# Patient Record
Sex: Male | Born: 1955 | Race: White | Hispanic: No | Marital: Married | Smoking: Never smoker
Health system: Southern US, Community
[De-identification: ages and names within clinical notes are randomized; demographics above are authoritative.]

## PROBLEM LIST (undated history)

## (undated) DIAGNOSIS — R42 Dizziness and giddiness: Secondary | ICD-10-CM

## (undated) DIAGNOSIS — R200 Anesthesia of skin: Secondary | ICD-10-CM

## (undated) DIAGNOSIS — K5790 Diverticulosis of intestine, part unspecified, without perforation or abscess without bleeding: Secondary | ICD-10-CM

## (undated) HISTORY — PX: EYE SURGERY: SHX253

## (undated) HISTORY — PX: OTHER SURGICAL HISTORY: SHX169

## (undated) HISTORY — DX: Dizziness and giddiness: R42

## (undated) HISTORY — DX: Diverticulosis of intestine, part unspecified, without perforation or abscess without bleeding: K57.90

## (undated) HISTORY — DX: Anesthesia of skin: R20.0

---

## 2001-06-05 ENCOUNTER — Encounter: Payer: Self-pay | Admitting: Gastroenterology

## 2001-06-11 ENCOUNTER — Encounter: Payer: Self-pay | Admitting: Gastroenterology

## 2005-02-26 ENCOUNTER — Ambulatory Visit: Payer: Self-pay | Admitting: Internal Medicine

## 2006-12-16 ENCOUNTER — Ambulatory Visit: Payer: Self-pay | Admitting: Internal Medicine

## 2006-12-16 LAB — CONVERTED CEMR LAB
Chloride: 112 meq/L (ref 96–112)
Cholesterol: 147 mg/dL (ref 0–200)
GFR calc Af Amer: 114 mL/min
GFR calc non Af Amer: 94 mL/min
Glucose, Bld: 91 mg/dL (ref 70–99)
HDL: 44.1 mg/dL (ref >39.0)
LDL Cholesterol: 95 mg/dL (ref 0–99)
Potassium: 4.4 meq/L (ref 3.5–5.1)
Sodium: 145 meq/L (ref 135–145)
Total CHOL/HDL Ratio: 3.3
Triglycerides: 40 mg/dL (ref 0–149)

## 2008-01-08 ENCOUNTER — Ambulatory Visit: Payer: Self-pay | Admitting: Internal Medicine

## 2010-02-03 ENCOUNTER — Ambulatory Visit: Payer: Self-pay | Admitting: Internal Medicine

## 2010-02-03 LAB — CONVERTED CEMR LAB
ALT: 22 units/L (ref 0–53)
AST: 25 units/L (ref 0–37)
Albumin: 4.3 g/dL (ref 3.5–5.2)
Bilirubin Urine: NEGATIVE
Chloride: 104 meq/L (ref 96–112)
Eosinophils Relative: 2.5 % (ref 0.0–5.0)
GFR calc non Af Amer: 76.75 mL/min (ref >60)
Glucose, Bld: 98 mg/dL (ref 70–99)
HCT: 42.8 % (ref 39.0–52.0)
Hemoglobin: 14.9 g/dL (ref 13.0–17.0)
Ketones, ur: NEGATIVE mg/dL
Leukocytes, UA: NEGATIVE
Lymphs Abs: 1.9 10*3/uL (ref 0.7–4.0)
Monocytes Relative: 8.3 % (ref 3.0–12.0)
Neutro Abs: 3.1 10*3/uL (ref 1.4–7.7)
Potassium: 4.9 meq/L (ref 3.5–5.1)
RDW: 13.3 % (ref 11.5–14.6)
Sodium: 138 meq/L (ref 135–145)
TSH: 2.32 microintl units/mL (ref 0.35–5.50)
Urobilinogen, UA: 0.2 (ref 0.0–1.0)
VLDL: 11.6 mg/dL (ref 0.0–40.0)

## 2010-02-10 ENCOUNTER — Ambulatory Visit: Payer: Self-pay | Admitting: Internal Medicine

## 2010-02-10 ENCOUNTER — Encounter: Payer: Self-pay | Admitting: Internal Medicine

## 2010-02-10 DIAGNOSIS — N400 Enlarged prostate without lower urinary tract symptoms: Secondary | ICD-10-CM | POA: Insufficient documentation

## 2010-02-10 DIAGNOSIS — G479 Sleep disorder, unspecified: Secondary | ICD-10-CM | POA: Insufficient documentation

## 2010-02-22 ENCOUNTER — Encounter (INDEPENDENT_AMBULATORY_CARE_PROVIDER_SITE_OTHER): Payer: Self-pay | Admitting: *Deleted

## 2010-04-10 ENCOUNTER — Encounter: Payer: Self-pay | Admitting: Gastroenterology

## 2010-04-10 ENCOUNTER — Ambulatory Visit
Admission: RE | Admit: 2010-04-10 | Discharge: 2010-04-10 | Payer: Self-pay | Source: Home / Self Care | Attending: Gastroenterology | Admitting: Gastroenterology

## 2010-04-10 DIAGNOSIS — K594 Anal spasm: Secondary | ICD-10-CM | POA: Insufficient documentation

## 2010-04-10 DIAGNOSIS — K921 Melena: Secondary | ICD-10-CM | POA: Insufficient documentation

## 2010-04-20 ENCOUNTER — Ambulatory Visit: Admit: 2010-04-20 | Payer: Self-pay | Admitting: Gastroenterology

## 2010-04-25 NOTE — Letter (Signed)
Summary: New Patient letter  [facility]  [address] [place], [place] [address]   Phone: [phone]  Fax: [phone]       02/22/2010 MRN: [id number]  Robert Nelson [address] [place], [place]  [address]  Dear Robert Nelson,  Welcome to the Gastroenterology Division at [facility].    You are scheduled to see Dr. Russella Dar on 04/10/2010 at 10:15AM on the 3rd floor at [facility], [address]  We ask that you try to arrive at our office 15 minutes prior to your appointment time to allow for check-in.  We would like you to complete the enclosed self-administered evaluation form prior to your visit and bring it with you on the day of your appointment.  We will review it with you.  Also, please bring a complete list of all your medications or, if you prefer, bring the medication bottles and we will list them.  Please bring your insurance card so that we may make a copy of it.  If your insurance requires a referral to see a specialist, please bring your referral form from your primary care physician.  Co-payments are due at the time of your visit and may be paid by cash, check or credit card.     Your office visit will consist of a consult with your physician (includes a physical exam), any laboratory testing he/she may order, scheduling of any necessary diagnostic testing (e.g. x-ray, ultrasound, CT-scan), and scheduling of a procedure (e.g. Endoscopy, Colonoscopy) if required.  Please allow enough time on your schedule to allow for any/all of these possibilities.    If you cannot keep your appointment, please call [phone] to cancel or reschedule prior to your appointment date.  This allows [name] the opportunity to schedule an appointment for another patient in need of care.  If you do not cancel or reschedule by 5 p.m. the business day prior to your appointment date, you will be charged a $50.00 late cancellation/no-show fee.    Thank you for choosing [facility] for your medical needs.  We appreciate the opportunity to care for you.  Please visit [name] at our website  to learn more about our practice.                     Sincerely,                                                             The Gastroenterology Division

## 2010-04-25 NOTE — Assessment & Plan Note (Signed)
Summary: PHYSICAL--STC   Vital Signs:  Patient profile:   55 year old male Height:      72 inches Weight:      172 pounds BMI:     23.41 O2 Sat:      97 % on Room air Temp:     98.3 degrees F oral Pulse rate:   82 / minute BP sitting:   100 / 72  (left arm) Cuff size:   regular  Vitals Entered By: Bill Salinas CMA (February 10, 2010 3:45 PM)  O2 Flow:  Room air CC: cpx/ ab   Primary Care Provider:  Jacques Navy MD  CC:  cpx/ ab.  History of Present Illness: Patient presents for routine medical follow-up.  Sleep issue - awakens after 2-4 hours and then dozes for 2 hrs. (operant conditioning with control of sleep interval.   Right shoulder pain - throwing a ball is most aggrevating. This has been a problem for years and never worked.  Slowed stream.  2-3 months ago had started have increased BMs to 5-6 per day from a base of 3-4.   Current Medications (verified): 1)  None  Allergies (verified): No Known Drug Allergies  Past History:  Past Medical History: Last updated: 01/08/2008 Unremarkable  Past Surgical History: ORIF wrist fracture at age 58 Vasectomy Eye surgery for wandering eye at age 64  Family History: father - 70: no health problems mother - 1932: OA knee s/p surgery Neg-DM, prostate cancer, CAD PUnlce - colon cancer,died in his 62's  Social History: [place]-[employer] education married '77 1 son - '82, 1 dtr '84; 1 grandchild work - Insurance underwriter  Never Smoked Alcohol use-no Drug use-no Regular exercise-yes  Review of Systems  The patient denies anorexia, fever, weight loss, weight gain, vision loss, decreased hearing, chest pain, syncope, dyspnea on exertion, peripheral edema, prolonged cough, hemoptysis, abdominal pain, hematochezia, severe indigestion/heartburn, incontinence, muscle weakness, transient blindness, difficulty walking, depression, unusual weight change, enlarged lymph nodes, angioedema, and testicular masses.     Physical Exam  General:  Well-developed,well-nourished,in no acute distress; alert,appropriate and cooperative throughout examination Head:  Normocephalic and atraumatic without obvious abnormalities. No apparent alopecia or balding. Eyes:  No corneal or conjunctival inflammation noted. EOMI. Perrla. Funduscopic exam benign, without hemorrhages, exudates or papilledema. Vision grossly normal. Ears:  External ear exam shows no significant lesions or deformities.  Otoscopic examination reveals clear canals, tympanic membranes are intact bilaterally without bulging, retraction, inflammation or discharge. Hearing is grossly normal bilaterally. Nose:  no external deformity and no external erythema.   Mouth:  Oral mucosa and oropharynx without lesions or exudates.  Teeth in good repair. Neck:  supple, no thyromegaly, and no carotid bruits.   Chest Wall:  No deformities, masses, tenderness or gynecomastia noted. Lungs:  Normal respiratory effort, chest expands symmetrically. Lungs are clear to auscultation, no crackles or wheezes. Heart:  Normal rate and regular rhythm. S1 and S2 normal without gallop, murmur, click, rub or other extra sounds. Abdomen:  Bowel sounds positive,abdomen soft and non-tender without masses, organomegaly or hernias noted. Prostate:  deferred to normal PSA Msk:  Right shoulder with normal ROM, no crepitis, no click. Remainder of exam normal Pulses:  2+ radial and DP pusles Extremities:  No clubbing, cyanosis, edema, or deformity noted with normal full range of motion of all joints.   Neurologic:  alert & oriented X3, cranial nerves II-XII intact, gait normal, and DTRs symmetrical and normal.   Skin:  turgor normal, color  normal, no rashes, and no suspicious lesions.   Cervical Nodes:  no anterior cervical adenopathy and no posterior cervical adenopathy.   Inguinal Nodes:  no R inguinal adenopathy and no L inguinal adenopathy.   Psych:  Oriented X3, normally interactive,  good eye contact, and not anxious appearing.     Impression & Recommendations:  Problem # 1:  OTHER SYMPTOMS INVOLVING DIGESTIVE SYSTEM OTHER (ICD-787.99)  Patient with a change in bowel habit: frequency and caliber.  Plan - diagnostic colonosocopy  Orders: Gastroenterology Referral (GI)  Problem # 2:  HYPERTROPHY PROSTATE W/O UR OBST & OTH LUTS (ICD-600.00) Patient with early signs of prostatism. He is not disturbed by his symptoms. He has no sleep disruption or issues of urgency.  Plan - watchful waiting.  Problem # 3:  SLEEP DISORDER, CHRONIC (ICD-780.50) Patient with Stein term sleep disorder with short duration of sleep. Reviewed sleep hygiene: regular schedule for retiring and rising; no stimulants; exercise but at least 3 hours before retiring; sleep sanctuary; avoiding extinction behaviors - laying in bed awake. discussed operant conditioning of setting sleep interval to just beyond his current sleep interval and extending in 30 minute increments when he attains goal interval 80% of the time. May use a hypnotic every 3rd to 4th night as needed.  Problem # 4:  Preventive Health Care (ICD-V70.0) Interval history is unremarkable except for problems listed above. Physical exam is normal, including a normal shoulder exam. If this continues to bother him a sports medicine consult will be arranged. Lab results are within normal limits. 12 lead EKG without evidence of injury or ischemia.Imunizations - patient believes tetnus is up to date.  In summary- a very nice man who appears healty. the change in bowel habit does require evaluation. If all goes well I have recommended follow-up general physical exam in 3 years with routine labs at that time. He will otherwise return on a as needed basis.   Other Orders: EKG w/ Interpretation (93000)  Patient: Jazmine Wickizer Note: All result statuses are Final unless otherwise noted.  Tests: (1) Lipid Panel (LIPID)   Cholesterol               192  mg/dL                   1-914     ATP III Classification            Desirable:  < 200 mg/dL                    Borderline High:  200 - 239 mg/dL               High:  > = 240 mg/dL   Triglycerides             58.0 mg/dL                  7.8-295.6     Normal:  <150 mg/dL     Borderline High:  213 - 199 mg/dL   HDL                       08.65 mg/dL                 >78.46   VLDL Cholesterol          11.6 mg/dL                  9.6-29.5   LDL  Cholesterol      [H]  121 mg/dL                   1-61  CHO/HDL Ratio:  CHD Risk                             3                    Men          Women     1/2 Average Risk     3.4          3.3     Average Risk          5.0          4.4     2X Average Risk          9.6          7.1     3X Average Risk          15.0          11.0                           Tests: (2) BMP (METABOL)   Sodium                    138 mEq/L                   135-145   Potassium                 4.9 mEq/L                   3.5-5.1   Chloride                  104 mEq/L                   96-112   Carbon Dioxide            30 mEq/L                    19-32   Glucose                   98 mg/dL                    09-60   BUN                       16 mg/dL                    4-54   Creatinine                1.1 mg/dL                   0.9-8.1   Calcium                   9.7 mg/dL                   1.9-14.7   GFR                       76.75 mL/min                >  60  Tests: (3) CBC Platelet w/Diff (CBCD)   White Cell Count          5.6 K/uL                    4.5-10.5   Red Cell Count            4.50 Mil/uL                 4.22-5.81   Hemoglobin                14.9 g/dL                   29.5-28.4   Hematocrit                42.8 %                      39.0-52.0   MCV                       95.3 fl                     78.0-100.0   MCHC                      34.7 g/dL                   13.2-44.0   RDW                       13.3 %                      11.5-14.6   Platelet Count             172.0 K/uL                  150.0-400.0   Neutrophil %              55.0 %                      43.0-77.0   Lymphocyte %              33.6 %                      12.0-46.0   Monocyte %                8.3 %                       3.0-12.0   Eosinophils%              2.5 %                       0.0-5.0   Basophils %               0.6 %                       0.0-3.0   Neutrophill Absolute      3.1 K/uL                    1.4-7.7   Lymphocyte Absolute       1.9 K/uL  0.7-4.0   Monocyte Absolute         0.5 K/uL                    0.1-1.0  Eosinophils, Absolute                             0.1 K/uL                    0.0-0.7   Basophils Absolute        0.0 K/uL                    0.0-0.1  Tests: (4) Hepatic/Liver Function Panel (HEPATIC)   Total Bilirubin           0.7 mg/dL                   3.7-1.6   Direct Bilirubin          0.1 mg/dL                   9.6-7.8   Alkaline Phosphatase      49 U/L                      39-117   AST                       25 U/L                      0-37   ALT                       22 U/L                      0-53   Total Protein             7.5 g/dL                    9.3-8.1   Albumin                   4.3 g/dL                    0.1-7.5  Tests: (5) TSH (TSH)   FastTSH                   2.32 uIU/mL                 0.35-5.50  Tests: (6) UDip Only (UDIP)   Color                     LT. YELLOW       RANGE:  Yellow;Lt. Yellow   Clarity                   CLEAR                       Clear   Specific Gravity          <=1.005                     1.000 - 1.030   Urine Ph                  6.5  5.0-8.0   Protein                   NEGATIVE                    Negative   Urine Glucose             NEGATIVE                    Negative   Ketones                   NEGATIVE                    Negative   Urine Bilirubin           NEGATIVE                    Negative   Blood                     NEGATIVE                    Negative    Urobilinogen              0.2                         0.0 - 1.0   Leukocyte Esterace        NEGATIVE                    Negative   Nitrite                   NEGATIVE                    Negative  Tests: (7) Prostate Specific Antigen (PSA)   PSA-Hyb                   0.21 ng/mL                  0.10-4.00Prescriptions: LOSARTAN POTASSIUM-HCTZ 100-25 MG TABS (LOSARTAN POTASSIUM-HCTZ) 1 by mouth once daily for BP  #90 x 3   Entered and Authorized by:   Jacques Navy MD   Signed by:   Jacques Navy MD on 02/10/2010   Method used:   Electronically to        [facility] [phone]* (retail)       [address] [place], [place]  [address]       Ph: [phone]       Fax: [phone]   RxID:   [id number]    Orders Added: 1)  Gastroenterology Referral [GI] 2)  Est. Patient 40-64 years [99396] 3)  New Patient Level III [24401] 4)  EKG w/ Interpretation [93000]

## 2010-04-27 NOTE — Procedures (Signed)
Summary: Colonoscopy   Colonoscopy  Procedure date:  06/05/2001  Findings:      Results: Hemorrhoids.     Location:  [facility].    Procedures Next Due Date:    Colonoscopy: 05/2008  Colonoscopy  Procedure date:  06/05/2001  Findings:      Results: Hemorrhoids.     Location:   Endoscopy Center.    Procedures Next Due Date:    Colonoscopy: 05/2008 Patient Name: Robert Nelson, Robert Nelson MRN:  Procedure Procedures: Colonoscopy CPT: 16109.  Personnel: Endoscopist: Venita Lick. Russella Dar, MD, Clementeen Graham.  Referred By: Rosalyn Gess. Norins, MD.  Exam Location: Exam performed in Outpatient Clinic. Outpatient  Patient Consent: Procedure, Alternatives, Risks and Benefits discussed, consent obtained, from patient. Consent was obtained by the RN.  Indications  Evaluation of: Positive fecal occult blood test  Symptoms: LLQ pain.  History  Pre-Exam Physical: Performed Jun 05, 2001. Entire physical exam was normal.  Exam Exam: Extent of exam reached: Cecum, extent intended: Cecum.  The cecum was identified by appendiceal orifice and IC valve. Colon retroflexion performed. ASA Classification: II. Tolerance: good.  Monitoring: Pulse and BP monitoring, Oximetry used. Supplemental O2 given.  Colon Prep Used Golytely for colon prep. Prep results: excellent.  Sedation Meds: Patient assessed and found to be appropriate for moderate (conscious) sedation. Fentanyl 100 mcg. given IV. Versed 10 mg. given IV.  Findings NORMAL EXAM: Cecum to Sigmoid Colon.  HEMORRHOIDS: Internal. Size: Medium. Not bleeding. Not thrombosed. ICD9: Hemorrhoids, Internal: 455.0.   Assessment  Diagnoses: 455.0: Hemorrhoids, Internal.   Events  Unplanned Interventions: No intervention was required.  Unplanned Events: There were no complications. Plans Patient Education: Patient given standard instructions for: Hemorrhoids.  Disposition: After procedure patient sent to recovery. After  recovery patient sent home.  Scheduling/Referral: Colonoscopy, to [facility]. Russella Dar, MD, [facility], around Jun 05, 2008.  EGD, to [employer]. Russella Dar, MD, Clementeen Graham,  Referring provider, to Rosalyn Gess. Norins, MD,    This report was created from the original endoscopy report, which was reviewed and signed by the above listed endoscopist.    cc: Illene Regulus, MD

## 2010-04-27 NOTE — Procedures (Signed)
Summary: EGD   EGD  Procedure date:  06/11/2001  Findings:      Findings: Gastritis  Location:  [facility]oscopy Center   Patient Name: Robert Nelson, Robert Nelson MRN:  Procedure Procedures: Panendoscopy (EGD) CPT: 43235.  Personnel: Endoscopist: Venita Lick. Russella Dar, MD, Clementeen Graham.  Exam Location: Exam performed in Outpatient Clinic. Outpatient  Patient Consent: Procedure, Alternatives, Risks and Benefits discussed, consent obtained, from patient. Consent was obtained by the RN.  Indications  Evaluation of: Positive fecal occult blood test  Symptoms: Abdominal pain, location: RLQ.  History  Pre-Exam Physical: Performed Jun 05, 2001  Entire physical exam was normal.  Exam Exam Info: Maximum depth of insertion Duodenum, intended Duodenum. Vocal cords not visualized. Gastric retroflexion performed. ASA Classification: II. Tolerance: good.  Sedation Meds: Patient assessed and found to be appropriate for moderate (conscious) sedation. Fentanyl 50 mcg. given IV. Versed 5 mg. given IV. Cetacaine Spray 1 sprays given aerosolized.  Monitoring: BP and pulse monitoring done. Oximetry used. Supplemental O2 given  Findings Normal: Proximal Esophagus to Distal Esophagus.  MUCOSAL ABNORMALITY: Body to Fundus. Thin (atrophic) folds. ICD9: Gastritis, Unspecified: 535.50.  Normal: Antrum to Duodenal 2nd Portion.   Assessment  Diagnoses: 535.50: Gastritis, Unspecified.   Events  Unplanned Intervention: No unplanned interventions were required.  Unplanned Events: There were no complications. Plans Medication(s): Continue current medications.  Disposition: After procedure patient sent to recovery. After recovery patient sent home.  Scheduling: Primary Care Provider, to Rosalyn Gess. Norins, MD, consider abd/pelvic CT if lower abd pain persists Jul 12, 2001.    This report was created from the original endoscopy report, which was reviewed and signed by the above listed endoscopist.    cc:  Illene Regulus, MD

## 2010-04-27 NOTE — Assessment & Plan Note (Signed)
Summary: CHANGE IN BM'S...AS.   History of Present Illness Visit Type: Initial Consult Primary GI MD: Elie Goody MD [facility] Primary Provider: Jacques Navy MD Requesting Provider: Illene Regulus, MD Chief Complaint: frequency in bowel movement 5-6 x daily History of Present Illness:   Robert Nelson is a 55 year old male, who has recently noted a change in bowel habits along with scant amounts of rectal bleeding. He states he typically had 2-3 bowel movements daily, however, for several weeks in October 5-6 bowel movements daily. He has also noted longer and thinner stools recently. He noted scant amounts of bright red blood per rectum with bowel movements on several occasions. He has had intermittent problems episodes of severe, rectal pain, brief in duration.   GI Review of Systems    Reports bloating.      Denies abdominal pain, acid reflux, belching, chest pain, dysphagia with liquids, dysphagia with solids, heartburn, loss of appetite, nausea, vomiting, vomiting blood, weight loss, and  weight gain.      Reports change in bowel habits and  rectal bleeding.     Denies anal fissure, black tarry stools, constipation, diarrhea, diverticulosis, fecal incontinence, heme positive stool, hemorrhoids, irritable bowel syndrome, jaundice, light color stool, liver problems, and  rectal pain.   Current Medications (verified): 1)  Ambien 10 Mg Tabs (Zolpidem Tartrate) .... As Needed  Allergies (verified): No Known Drug Allergies  Past History:  Past Medical History: Reviewed history from 04/05/2010 and no changes required. Internal hemorrhoids  Past Surgical History: Reviewed history from 02/10/2010 and no changes required. ORIF wrist fracture at age 64 Vasectomy Eye surgery for wandering eye at age 47  Family History: Reviewed history from 02/10/2010 and no changes required. 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         The pertinent positives and negatives are noted as above and in the HPI. All other ROS were reviewed and were negative.   Vital Signs:  Patient profile:   55 year old male Height:      72 inches Weight:      172.38 pounds BMI:     23.46 Pulse rate:   80 / minute Pulse rhythm:   regular BP sitting:   104 / 70  (left arm) Cuff size:   regular  Vitals Entered By: June McMurray CMA Duncan Dull) (April 10, 2010 10:18 AM)  Physical Exam  General:  Well developed, well nourished, no acute distress. Head:  Normocephalic and atraumatic. Eyes:  PERRLA, no icterus. Ears:  Normal auditory acuity. Mouth:  No deformity or lesions, dentition normal. Neck:  Supple; no masses or thyromegaly. Lungs:  Clear throughout to auscultation. Heart:  Regular rate and rhythm; no murmurs, rubs,  or bruits. Abdomen:  Soft, nontender and nondistended. No masses, hepatosplenomegaly or hernias noted. Normal bowel sounds. Rectal:  Normal exam. hemocult negative.   Msk:  Symmetrical with no gross deformities. Normal posture. Pulses:  Normal pulses noted. Extremities:  No clubbing, cyanosis, edema or deformities noted. Neurologic:  Alert and  oriented x4;  grossly normal neurologically. Cervical Nodes:  No significant cervical  adenopathy. Inguinal Nodes:  No significant inguinal adenopathy. Psych:  Alert and cooperative. Normal mood and affect.  Impression & Recommendations:  Problem # 1:  OTHER SYMPTOMS INVOLVING DIGESTIVE SYSTEM OTHER (ICD-787.99) Change in bowel habits with a change in stool caliber. Rule out colorectal neoplasms. The risks, benefits and alternatives to colonoscopy  with possible biopsy and possible polypectomy were discussed with the patient and they consent to proceed. The procedure will be scheduled electively. Orders: Colonoscopy (Colon)  Problem # 2:  BLOOD IN STOOL (ICD-578.1) Small amounts of bright red blood per rectum. Possible benign anorectal disease. Rule out colorectal neoplasms. See problem #1.  Problem # 3:  PROCTALGIA FUGAX (ICD-564.6)  Patient Instructions: 1)  Pick up your prep from your pharmacy.  2)  Colonoscopy brochure given.  3)  Copy sent to : Illene Regulus, MD 4)  The medication list was reviewed and reconciled.  All changed / newly prescribed medications were explained.  A complete medication list was provided to the patient / caregiver.  Prescriptions: MOVIPREP 100 GM  SOLR (PEG-KCL-NACL-NASULF-NA ASC-C) As per prep instructions.  #1 x 0   Entered by:   Christie Nottingham CMA (AAMA)   Authorized by:   Meryl Dare MD [facility]   Signed by:   Christie Nottingham CMA (AAMA) on 04/10/2010   Method used:   Electronically to        [id number]* (retail)       [address]       [place], [place]  [address]       Ph: [phone]       Fax: [phone]   RxID:   [id number]

## 2010-04-27 NOTE — Letter (Signed)
Summary: [facility] Instructions  [facility]  [address] [place], [place] [address]   Phone: [phone]  Fax: [phone]       Robert Nelson    Oct 01, 1955    MRN: [id number]        Procedure Day /Date: Thursday January 26th, 2012     Arrival Time: 10:30am     Procedure Time: 11;30am     Location of Procedure:                    _x _  [facility] (4th Floor)                        PREPARATION FOR COLONOSCOPY WITH MOVIPREP   Starting 5 days prior to your procedure 04/15/10 do not eat nuts, seeds, popcorn, corn, beans, peas,  salads, or any raw vegetables.  Do not take any fiber supplements (e.g. Metamucil, Citrucel, and Benefiber).  THE DAY BEFORE YOUR PROCEDURE         DATE: 04/19/10  ONG:EXBMWUXLK  1.  Drink clear liquids the entire day-NO SOLID FOOD  2.  Do not drink anything colored red or purple.  Avoid juices with pulp.  No orange juice.  3.  Drink at least 64 oz. (8 glasses) of fluid/clear liquids during the day to prevent dehydration and help the prep work efficiently.  CLEAR LIQUIDS INCLUDE: Water Jello Ice Popsicles Tea (sugar ok, no milk/cream) Powdered fruit flavored drinks Coffee (sugar ok, no milk/cream) Gatorade Juice: apple, white grape, white cranberry  Lemonade Clear bullion, consomm, broth Carbonated beverages (any kind) Strained chicken noodle soup Hard Candy                             4.  In the morning, mix first dose of MoviPrep solution:    Empty 1 Pouch A and 1 Pouch B into the disposable container    Add lukewarm drinking water to the top line of the container. Mix to dissolve    Refrigerate (mixed solution should be used within 24 hrs)  5.  Begin drinking the prep at 5:00 p.m. The MoviPrep container is divided by 4 marks.   Every 15 minutes drink the solution down to the next mark (approximately 8 oz) until the full liter is complete.   6.  Follow completed prep with 16 oz of clear liquid of your choice  (Nothing red or purple).  Continue to drink clear liquids until bedtime.  7.  Before going to bed, mix second dose of MoviPrep solution:    Empty 1 Pouch A and 1 Pouch B into the disposable container    Add lukewarm drinking water to the top line of the container. Mix to dissolve    Refrigerate  THE DAY OF YOUR PROCEDURE      DATE: 04/20/10 GMW:[id number]  Beginning at 6:30 a.m. (5 hours before procedure):         1. Every 15 minutes, drink the solution down to the next mark (approx 8 oz) until the full liter is complete.  2. Follow completed prep with 16 oz. of clear liquid of your choice.    3. You may drink clear liquids until 9:30am (2 HOURS BEFORE PROCEDURE).   MEDICATION INSTRUCTIONS  Unless otherwise instructed, you should take regular prescription medications with a small sip of water   as early as possible the morning of your procedure.  OTHER INSTRUCTIONS  You will need a responsible adult at least 57 years of age to accompany you and drive you home.   This person must remain in the waiting room during your procedure.  Wear loose fitting clothing that is easily removed.  Leave jewelry and other valuables at home.  However, you may wish to bring a book to read or  an iPod/MP3 player to listen to music as you wait for your procedure to start.  Remove all body piercing jewelry and leave at home.  Total time from sign-in until discharge is approximately 2-3 hours.  You should go home directly after your procedure and rest.  You can resume normal activities the  day after your procedure.  The day of your procedure you should not:   Drive   Make legal decisions   Operate machinery   Drink alcohol   Return to work  You will receive specific instructions about eating, activities and medications before you leave.    The above instructions have been reviewed and explained to me by   _______________________    I fully understand and can verbalize  these instructions _____________________________ Date _________

## 2010-05-12 ENCOUNTER — Encounter: Payer: Self-pay | Admitting: Gastroenterology

## 2010-05-12 ENCOUNTER — Other Ambulatory Visit (AMBULATORY_SURGERY_CENTER): Payer: BC Managed Care – PPO | Admitting: Gastroenterology

## 2010-05-12 DIAGNOSIS — K648 Other hemorrhoids: Secondary | ICD-10-CM

## 2010-05-12 DIAGNOSIS — K921 Melena: Secondary | ICD-10-CM

## 2010-05-12 DIAGNOSIS — K5730 Diverticulosis of large intestine without perforation or abscess without bleeding: Secondary | ICD-10-CM

## 2010-05-12 DIAGNOSIS — R198 Other specified symptoms and signs involving the digestive system and abdomen: Secondary | ICD-10-CM

## 2010-05-17 NOTE — Procedures (Addendum)
Summary: Colonoscopy  Patient: Robert Nelson Note: All result statuses are Final unless otherwise noted.  Tests: (1) Colonoscopy (COL)   COL Colonoscopy           DONE     [facility]     [address]     [place], [place]  [address]           COLONOSCOPY PROCEDURE REPORT     PATIENT:  Robert Nelson, Robert Nelson  MR#:  [id number]     BIRTHDATE:  02-16-1956, 54 yrs. old  GENDER:  male     ENDOSCOPIST:  Judie Petit T. Russella Dar, MD, [facility]           PROCEDURE DATE:  05/12/2010     PROCEDURE:  Colonoscopy 295[address]     ASA CLASS:  Class II     INDICATIONS:  1) hematochezia  2) change in bowel habits     MEDICATIONS:   Fentanyl 100 mcg IV, Versed 10 mg IV     DESCRIPTION OF PROCEDURE:   After the risks benefits and     alternatives of the procedure were thoroughly explained, informed     consent was obtained.  Digital rectal exam was performed and     revealed no abnormalities.   The LB PCF-[id number] endoscope     was introduced through the anus and advanced to the cecum, which     was identified by both the appendix and ileocecal valve, without     limitations.  The quality of the prep was excellent, using     MoviPrep.  The instrument was then slowly withdrawn as the colon     was fully examined.     <<PROCEDUREIMAGES>>     FINDINGS:  Mild diverticulosis was found in the sigmoid colon. A     normal appearing cecum, ileocecal valve, and appendiceal orifice     were identified. The ascending, hepatic flexure, transverse,     splenic flexure, descending colon, and rectum appeared     unremarkable. Retroflexed views in the rectum revealed internal     hemorrhoids, small. The time to cecum =  5.75  minutes. The scope     was then withdrawn (time =  10  min) from the patient and the     procedure completed.     COMPLICATIONS:  None           ENDOSCOPIC IMPRESSION:     1) Mild diverticulosis in the sigmoid colon     2) Internal hemorrhoids     RECOMMENDATIONS:     1) High fiber diet with liberal  fluid intake.     2) Continue current colorectal screening for "routine risk"     patients with a repeat colonoscopy in 10 years.           Venita Lick. Russella Dar, MD, [facility]           CC:  Jacques Navy, MD           n.     Rosalie DoctorVenita Lick. Snyder Colavito at 05/12/2010 12:20 PM           Forbes, Loll, [id number]  Note: An exclamation mark (!) indicates a result that was not dispersed into the flowsheet. Document Creation Date: 05/12/2010 12:21 PM _______________________________________________________________________  (1) Order result status: Final Collection or observation date-time: 05/12/2010 12:17 Requested date-time:  Receipt date-time:  Reported date-time:  Referring Physician:   Ordering Physician: Claudette Head [id number]) Specimen Source:  Source: Kem Parkinson  Filler Order Number: [id number] Lab site:   Appended Document: Colonoscopy    Clinical Lists Changes  Observations: Added new observation of COLONNXTDUE: 04/2020 (05/15/2010 10:46)

## 2011-07-09 ENCOUNTER — Telehealth: Payer: Self-pay | Admitting: Internal Medicine

## 2011-07-09 NOTE — Telephone Encounter (Signed)
Received copies from [facility] ,on 07/09/11. Forwarded 3 pages to Dr.Norins ,for review.

## 2014-11-17 ENCOUNTER — Other Ambulatory Visit: Payer: Self-pay | Admitting: Otolaryngology

## 2014-11-17 DIAGNOSIS — K219 Gastro-esophageal reflux disease without esophagitis: Secondary | ICD-10-CM

## 2014-11-22 ENCOUNTER — Ambulatory Visit
Admission: RE | Admit: 2014-11-22 | Discharge: 2014-11-22 | Disposition: A | Discharge status: Home / Self Care | Payer: BLUE CROSS/BLUE SHIELD | Source: Ambulatory Visit | Attending: Otolaryngology | Admitting: Otolaryngology

## 2014-11-22 DIAGNOSIS — K219 Gastro-esophageal reflux disease without esophagitis: Secondary | ICD-10-CM

## 2014-11-22 MED ORDER — IOPAMIDOL (ISOVUE-300) INJECTION 61%
75.0000 mL | Freq: Once | INTRAVENOUS | Status: AC | PRN
  Administered 2014-11-22: 75 mL via INTRAVENOUS

## 2015-11-25 DIAGNOSIS — Z1211 Encounter for screening for malignant neoplasm of colon: Secondary | ICD-10-CM | POA: Diagnosis not present

## 2015-11-25 DIAGNOSIS — Z23 Encounter for immunization: Secondary | ICD-10-CM | POA: Diagnosis not present

## 2015-11-25 DIAGNOSIS — Z0000 Encounter for general adult medical examination without abnormal findings: Secondary | ICD-10-CM | POA: Diagnosis not present

## 2016-05-30 DIAGNOSIS — R202 Paresthesia of skin: Secondary | ICD-10-CM | POA: Diagnosis not present

## 2016-06-01 ENCOUNTER — Other Ambulatory Visit: Payer: Self-pay | Admitting: Family Medicine

## 2016-06-01 DIAGNOSIS — R202 Paresthesia of skin: Secondary | ICD-10-CM

## 2016-06-11 ENCOUNTER — Ambulatory Visit
Admission: RE | Admit: 2016-06-11 | Discharge: 2016-06-11 | Disposition: A | Discharge status: Home / Self Care | Payer: BLUE CROSS/BLUE SHIELD | Source: Ambulatory Visit | Attending: Family Medicine | Admitting: Family Medicine

## 2016-06-11 DIAGNOSIS — R202 Paresthesia of skin: Secondary | ICD-10-CM

## 2016-06-11 DIAGNOSIS — M50223 Other cervical disc displacement at C6-C7 level: Secondary | ICD-10-CM | POA: Diagnosis not present

## 2016-06-11 MED ORDER — GADOBENATE DIMEGLUMINE 529 MG/ML IV SOLN
15.0000 mL | Freq: Once | INTRAVENOUS | Status: AC | PRN
  Administered 2016-06-11: 15 mL via INTRAVENOUS

## 2016-06-13 ENCOUNTER — Encounter: Payer: Self-pay | Admitting: Neurology

## 2016-07-23 DIAGNOSIS — L723 Sebaceous cyst: Secondary | ICD-10-CM | POA: Diagnosis not present

## 2016-07-23 DIAGNOSIS — L814 Other melanin hyperpigmentation: Secondary | ICD-10-CM | POA: Diagnosis not present

## 2016-07-23 DIAGNOSIS — L820 Inflamed seborrheic keratosis: Secondary | ICD-10-CM | POA: Diagnosis not present

## 2016-07-23 DIAGNOSIS — L818 Other specified disorders of pigmentation: Secondary | ICD-10-CM | POA: Diagnosis not present

## 2016-07-23 DIAGNOSIS — D485 Neoplasm of uncertain behavior of skin: Secondary | ICD-10-CM | POA: Diagnosis not present

## 2016-07-23 DIAGNOSIS — L821 Other seborrheic keratosis: Secondary | ICD-10-CM | POA: Diagnosis not present

## 2016-07-23 DIAGNOSIS — D225 Melanocytic nevi of trunk: Secondary | ICD-10-CM | POA: Diagnosis not present

## 2016-08-15 ENCOUNTER — Other Ambulatory Visit (INDEPENDENT_AMBULATORY_CARE_PROVIDER_SITE_OTHER): Payer: BLUE CROSS/BLUE SHIELD

## 2016-08-15 ENCOUNTER — Encounter: Payer: Self-pay | Admitting: Neurology

## 2016-08-15 ENCOUNTER — Ambulatory Visit (INDEPENDENT_AMBULATORY_CARE_PROVIDER_SITE_OTHER): Payer: BLUE CROSS/BLUE SHIELD | Admitting: Neurology

## 2016-08-15 VITALS — BP 100/60 | HR 79 | Ht 72.0 in | Wt 159.4 lb

## 2016-08-15 DIAGNOSIS — R202 Paresthesia of skin: Secondary | ICD-10-CM

## 2016-08-15 DIAGNOSIS — G629 Polyneuropathy, unspecified: Secondary | ICD-10-CM | POA: Diagnosis not present

## 2016-08-15 LAB — VITAMIN B12: Vitamin B-12: 294 pg/mL (ref 211–911)

## 2016-08-15 LAB — SEDIMENTATION RATE: SED RATE: 16 mm/h (ref 0–20)

## 2016-08-15 NOTE — Patient Instructions (Addendum)
1.  NCS/EMG of the legs 2.  Check labs  Return to clinic 4 months

## 2016-08-15 NOTE — Progress Notes (Signed)
[facility] Clinic Note - Initial Visit   Date: 08/15/16  Robert Nelson MRN: [id number] DOB: 1955/05/19   Dear Dr. Marisue Humble:  Thank you for your kind referral of Robert Nelson for consultation of paresthesias. Although his history is well known to you, please allow [name] to reiterate it for the purpose of our medical record. The patient was accompanied to the clinic by self.    History of Present Illness: Robert Nelson is a 61 y.o. right-handed Caucasian male with history of vertigo and diverticulosis presenting for evaluation of numbness/tingling of the arms and legs.    Starting around the summer of 2017, he was driving to [place], [place] and started having numbness of the left foot and when he got out of the car, he almost fell, because he was unable to feel his leg. This lasted a 15 minutes and was improved by walking.  Since then, he has developed similar spells of transient numbness/tingling of both legs and both arms which are sporadic.  He has noticed that symptoms are worse when standing.  He also has numbness from the knee to the hip which he noticed while he was in bed resting.   Currently, he has tingling of the left knee and right thigh, but this can change from day-to-day.  In the left arm, he tends to have tingling from the elbow into his forearm which is worse when he is using a computer mouse with that hand.   He denies any weakness of the hands and legs.  He denies any imbalance.  He was evaluated by his PCP for these symptoms who ordered labs (TSH, CMP) and MRI brain and cervical spine which showed mild small vessel disease and moderate C6 foraminal stenosis.    He drinks 2 beers about 3-5 times per week.  No history of diabetes or family history of neuropathy.  Out-side paper records, electronic medical record, and images have been reviewed where available and summarized as:  Labs 05/30/2016:  TSH 2.43, CMP normal  MRI brain and cervical spine  wwo contrast 06/12/2016: 1. Multifocal white matter hyperintensities in a nonspecific distribution, but most commonly seen in this age population as the sequelae of chronic microvascular ischemia. The pattern is not suggestive of demyelinating disease. 2. Moderate bilateral C6 neural foraminal stenosis and mild right C7 foraminal narrowing. 3. No evidence of spinal cord demyelination.  Past Medical History:  Diagnosis Date  . Diverticulosis   . Numbness   . Vertigo     Past Surgical History:  Procedure Laterality Date  . arm surgery    . EYE SURGERY       Medications:  Outpatient Encounter Prescriptions as of 08/15/2016  Medication Sig  . ibuprofen (ADVIL,MOTRIN) 200 MG tablet Take 200 mg by mouth every 6 (six) hours as needed.   No facility-administered encounter medications on file as of 08/15/2016.      Allergies: No Known Allergies  Family History: Father is healthy, 59. Mother is healthy, 42. Paternal grandmother lives to 54 Paternal aunt had cancer and paternal uncle had colon cancer. Maternal grandfather had carcinoma of the lip. No history of cardiovascular or neurological disease.  Social History: Social History  Substance Use Topics  . Smoking status: Never Smoker  . Smokeless tobacco: Never Used  . Alcohol use Yes  He drink 2 beers 3-4 times per week.   Social History   Social History Narrative   Lives with wife in a 2 story home with  basement.  Has 2 children.  Works as an Agricultural consultant.  Education: college.       Review of Systems:  CONSTITUTIONAL: No fevers, chills, night sweats, or weight loss.   EYES: No visual changes or eye pain ENT: No hearing changes.  No history of nose bleeds.   RESPIRATORY: No cough, wheezing and shortness of breath.   CARDIOVASCULAR: Negative for chest pain, and palpitations.   GI: Negative for abdominal discomfort, blood in stools or black stools.  No recent change in bowel habits.   GU:  No history of  incontinence.   MUSCLOSKELETAL: No history of joint pain or swelling.  No myalgias.   SKIN: Negative for lesions, rash, and itching.   HEMATOLOGY/ONCOLOGY: Negative for prolonged bleeding, bruising easily, and swollen nodes.  No history of cancer.   ENDOCRINE: Negative for cold or heat intolerance, polydipsia or goiter.   PSYCH:  No depression or anxiety symptoms.   NEURO: As Above.   Vital Signs:  BP 100/60   Pulse 79   Ht 6' (1.829 m)   Wt 159 lb 7 oz (72.3 kg)   SpO2 98%   BMI 21.62 kg/m    General Medical Exam:   General:  Well appearing, comfortable.   Eyes/ENT: see cranial nerve examination.   Neck: No masses appreciated.  Full range of motion without tenderness.  No carotid bruits. Respiratory:  Clear to auscultation, good air entry bilaterally.   Cardiac:  Regular rate and rhythm, no murmur.   Extremities:  No deformities, edema, or skin discoloration.  Skin:  No rashes or lesions.  Neurological Exam: MENTAL STATUS including orientation to time, place, person, recent and remote memory, attention span and concentration, language, and fund of knowledge is normal.  Speech is not dysarthric.  CRANIAL NERVES: II:  No visual field defects.  Unremarkable fundi.   III-IV-VI: Pupils equal round and reactive to light.  Normal conjugate, extra-ocular eye movements in all directions of gaze.  No nystagmus.  No ptosis.   V:  Normal facial sensation.    VII:  Normal facial symmetry and movements.   VIII:  Normal hearing and vestibular function.   IX-X:  Normal palatal movement.   XI:  Normal shoulder shrug and head rotation.   XII:  Normal tongue strength and range of motion, no deviation or fasciculation.  MOTOR:  No atrophy, fasciculations or abnormal movements.  No pronator drift.  Tone is normal.    Right Upper Extremity:    Left Upper Extremity:    Deltoid  5/5   Deltoid  5/5   Biceps  5/5   Biceps  5/5   Triceps  5/5   Triceps  5/5   Wrist extensors  5/5   Wrist  extensors  5/5   Wrist flexors  5/5   Wrist flexors  5/5   Finger extensors  5/5   Finger extensors  5/5   Finger flexors  5/5   Finger flexors  5/5   Dorsal interossei  5/5   Dorsal interossei  5/5   Abductor pollicis  5/5   Abductor pollicis  5/5   Tone (Ashworth scale)  0  Tone (Ashworth scale)  0   Right Lower Extremity:    Left Lower Extremity:    Hip flexors  5/5   Hip flexors  5/5   Hip extensors  5/5   Hip extensors  5/5   Knee flexors  5/5   Knee flexors  5/5   Knee extensors  5/5   Knee extensors  5/5   Dorsiflexors  5/5   Dorsiflexors  5/5   Plantarflexors  5/5   Plantarflexors  5/5   Toe extensors  5/5   Toe extensors  5/5   Toe flexors  5/5   Toe flexors  5/5   Tone (Ashworth scale)  0  Tone (Ashworth scale)  0   MSRs:  Right                                                                 Left brachioradialis 2+  brachioradialis 2+  biceps 2+  biceps 2+  triceps 2+  triceps 2+  patellar 2+  patellar 2+  ankle jerk 0  ankle jerk 0  Hoffman no  Hoffman no  plantar response down  plantar response down   SENSORY:  Vibration is trace at the great toe, intact at the ankles.  Temperature and pin prick is also reduced over the dorsum of the feet bilaterally.  Romberg's sign absent.   COORDINATION/GAIT: Normal finger-to- nose-finger.  Intact rapid alternating movements bilaterally.  Able to rise from a chair without using arms.  Gait narrow based and stable. Tandem and stressed gait intact.    IMPRESSION: Mr. Kessinger is a 61 year-old gentleman referred for evaluation of migratory paresthesias of the arms and legs.  His previous work-up has including MRI brain which was normal and age-appropriate.  MRI cervical spine shows mild central canal stenosis and biforaminal stenosis at C6, no nerve impingement which would explain the diffuse nature of his symptoms.  On exam, there is sensory loss involving the feet which is consistent with a peripheral neuropathy, however, he denies being  aware of numbness/tingling of the feet.  Sensory exam above the ankles is entirely normal.  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  Vitamin B12 deficiency may manifest with migratory paresthesias.  NCS/EMG of the legs will be performed to better characterize the nature of his symptoms and he will have laboratory testing for causes of neuropathy.  PLAN/RECOMMENDATIONS:  1. Check vitamin B12, vitamin B1, copper, 2-hour glucose tolerance testing, SPEP with IFE, ESR, SSA/B, heavy metal screen 2. NCS/EMG of the legs  Return to clinic in 4 months.   The duration of this appointment visit was 45 minutes of face-to-face time with the patient.  Greater than 50% of this time was spent in counseling, explanation of diagnosis, planning of further management, and coordination of care.   Thank you for allowing me to participate in patient's care.  If I can answer any additional questions, I would be pleased to do so.    Sincerely,    Donika K. Posey Pronto, DO

## 2016-08-16 LAB — PROTEIN ELECTROPHORESIS, SERUM
ALPHA-1-GLOBULIN: 0.2 g/dL (ref 0.2–0.3)
ALPHA-2-GLOBULIN: 0.6 g/dL (ref 0.5–0.9)
Albumin ELP: 4.7 g/dL (ref 3.8–4.8)
Beta 2: 0.4 g/dL (ref 0.2–0.5)
Beta Globulin: 0.5 g/dL (ref 0.4–0.6)
Gamma Globulin: 1.2 g/dL (ref 0.8–1.7)
TOTAL PROTEIN, SERUM ELECTROPHOR: 7.6 g/dL (ref 6.1–8.1)

## 2016-08-16 LAB — IMMUNOFIXATION ELECTROPHORESIS
IGA: 284 mg/dL (ref 81–463)
IGG (IMMUNOGLOBIN G), SERUM: 1372 mg/dL (ref 694–1618)
IGM, SERUM: 90 mg/dL (ref 48–271)

## 2016-08-16 LAB — SJOGREN'S SYNDROME ANTIBODS(SSA + SSB)
SSA (RO) (ENA) ANTIBODY, IGG: NEGATIVE
SSB (La) (ENA) Antibody, IgG: <1

## 2016-08-17 LAB — COPPER, SERUM: COPPER: 89 ug/dL (ref 70–175)

## 2016-08-20 LAB — VITAMIN B1: Vitamin B1 (Thiamine): 10 nmol/L (ref 8–30)

## 2016-08-21 ENCOUNTER — Telehealth: Payer: Self-pay | Admitting: *Deleted

## 2016-08-21 LAB — HEAVY METALS PANEL, BLOOD
Arsenic: <3 mcg/L (ref <23)
Lead: 1 ug/dL (ref <5)
Mercury, B: <4 mcg/L (ref <=10)

## 2016-08-21 NOTE — Telephone Encounter (Signed)
Patient given the results and instructions.   

## 2016-08-21 NOTE — Telephone Encounter (Signed)
-----   Message from Alda Berthold, DO sent at 08/21/2016 12:41 PM EDT ----- Please inform patient that his vitamin B12 is low-normal and I recommend he start vitamin B12 1023mcg OTC as a supplement.  The remaining labs are normal.  Thanks.

## 2016-08-28 ENCOUNTER — Encounter: Payer: BLUE CROSS/BLUE SHIELD | Admitting: Neurology

## 2016-09-18 ENCOUNTER — Encounter: Payer: BLUE CROSS/BLUE SHIELD | Admitting: Neurology

## 2016-10-22 DIAGNOSIS — H25013 Cortical age-related cataract, bilateral: Secondary | ICD-10-CM | POA: Diagnosis not present

## 2016-10-22 DIAGNOSIS — H04123 Dry eye syndrome of bilateral lacrimal glands: Secondary | ICD-10-CM | POA: Diagnosis not present

## 2016-10-22 DIAGNOSIS — D485 Neoplasm of uncertain behavior of skin: Secondary | ICD-10-CM | POA: Diagnosis not present

## 2016-10-22 DIAGNOSIS — L578 Other skin changes due to chronic exposure to nonionizing radiation: Secondary | ICD-10-CM | POA: Diagnosis not present

## 2016-10-22 DIAGNOSIS — H40013 Open angle with borderline findings, low risk, bilateral: Secondary | ICD-10-CM | POA: Diagnosis not present

## 2016-10-22 DIAGNOSIS — L814 Other melanin hyperpigmentation: Secondary | ICD-10-CM | POA: Diagnosis not present

## 2016-10-22 DIAGNOSIS — H2513 Age-related nuclear cataract, bilateral: Secondary | ICD-10-CM | POA: Diagnosis not present

## 2016-10-22 DIAGNOSIS — H40012 Open angle with borderline findings, low risk, left eye: Secondary | ICD-10-CM | POA: Diagnosis not present

## 2016-10-22 DIAGNOSIS — H524 Presbyopia: Secondary | ICD-10-CM | POA: Diagnosis not present

## 2016-10-22 DIAGNOSIS — H40011 Open angle with borderline findings, low risk, right eye: Secondary | ICD-10-CM | POA: Diagnosis not present

## 2016-12-14 DIAGNOSIS — Z23 Encounter for immunization: Secondary | ICD-10-CM | POA: Diagnosis not present

## 2016-12-14 DIAGNOSIS — Z0000 Encounter for general adult medical examination without abnormal findings: Secondary | ICD-10-CM | POA: Diagnosis not present

## 2016-12-14 DIAGNOSIS — Z13220 Encounter for screening for lipoid disorders: Secondary | ICD-10-CM | POA: Diagnosis not present

## 2016-12-14 DIAGNOSIS — Z125 Encounter for screening for malignant neoplasm of prostate: Secondary | ICD-10-CM | POA: Diagnosis not present

## 2016-12-14 DIAGNOSIS — Z131 Encounter for screening for diabetes mellitus: Secondary | ICD-10-CM | POA: Diagnosis not present

## 2016-12-19 ENCOUNTER — Ambulatory Visit: Payer: BLUE CROSS/BLUE SHIELD | Admitting: Neurology

## 2017-05-13 DIAGNOSIS — H5319 Other subjective visual disturbances: Secondary | ICD-10-CM | POA: Diagnosis not present

## 2017-05-13 DIAGNOSIS — H43391 Other vitreous opacities, right eye: Secondary | ICD-10-CM | POA: Diagnosis not present

## 2017-05-13 DIAGNOSIS — H43811 Vitreous degeneration, right eye: Secondary | ICD-10-CM | POA: Diagnosis not present

## 2017-06-25 DIAGNOSIS — H43391 Other vitreous opacities, right eye: Secondary | ICD-10-CM | POA: Diagnosis not present

## 2017-06-25 DIAGNOSIS — H43811 Vitreous degeneration, right eye: Secondary | ICD-10-CM | POA: Diagnosis not present

## 2017-06-25 DIAGNOSIS — H5319 Other subjective visual disturbances: Secondary | ICD-10-CM | POA: Diagnosis not present

## 2017-07-09 NOTE — Progress Notes (Signed)
[facility] Note  07/10/2017     CHIEF COMPLAINT Patient presents for Retina Evaluation   HISTORY OF PRESENT ILLNESS: Robert Nelson is a 62 y.o. male who presents to the clinic today for:   HPI    Retina Evaluation    In right eye.  Duration of 2 months.  Associated Symptoms Flashes and Floaters.  Negative for Distortion, Redness, Trauma, Shoulder/Hip pain, Fatigue, Weight Loss, Jaw Claudication, Glare, Pain, Blind Spot, Photophobia, Scalp Tenderness and Fever.  Context:  distance vision, mid-range vision and near vision.  Treatments tried include no treatments.  I, the attending physician,  performed the HPI with the patient and updated documentation appropriately.          Comments    Pt presents today on the referral of Dr. Kathlen Mody for persistent photopsia OD, pt saw Dr. Kathlen Mody 2 weeks ago, pt states he has not noticed any decrease in vision, but he is experiencing floaters OD as well, pt denies pain or wavy vision, pt denies the use of gtts/vits, pt denies being diabetic,        Last edited by Bernarda Caffey, MD on 07/10/2017  2:11 PM. (History)    Pt states he has been having flashes OD; Pt states flashes happen every day but states there is no pattern; Pt states the most he has ever seen flashes in 1 day is 4-5 times; Pt states he does not have constant flashes; Pt states he also have floaters off and on;   Referring physician: Hortencia Pilar, MD [place], [place] [address]  HISTORICAL INFORMATION:   Selected notes from the MEDICAL RECORD NUMBER Referred by Dr. Read Drivers for concern of persistent photopsia;  LEE- 04.02.19 (C. Weaver) [BCVA OD: 20/20 OS: 20/20-2] Ocular Hx- cataract OU, glaucoma suspect OU, DES OU, s/p strabismus sx (01.01.1962) PMH-     CURRENT MEDICATIONS: No current outpatient medications on file. (Ophthalmic Drugs)   No current facility-administered medications for this visit.  (Ophthalmic Drugs)    Current Outpatient Medications (Other)  Medication Sig  . ibuprofen (ADVIL,MOTRIN) 200 MG tablet Take 200 mg by mouth every 6 (six) hours as needed.   No current facility-administered medications for this visit.  (Other)      REVIEW OF SYSTEMS: ROS    Positive for: Eyes   Negative for: Constitutional, Gastrointestinal, Neurological, Skin, Genitourinary, Musculoskeletal, HENT, Endocrine, Cardiovascular, Respiratory, Psychiatric, Allergic/Imm, Heme/Lymph   Last edited by Debbrah Alar, COT on 07/10/2017  1:40 PM. (History)       ALLERGIES No Known Allergies  PAST MEDICAL HISTORY Past Medical History:  Diagnosis Date  . Diverticulosis   . Numbness   . Vertigo    Past Surgical History:  Procedure Laterality Date  . arm surgery    . EYE SURGERY      FAMILY HISTORY Family History  Problem Relation Age of Onset  . Cataracts Maternal Grandfather   . Strabismus Paternal Grandmother   . Cataracts Paternal Grandfather   . Amblyopia Paternal Grandfather   . Blindness Paternal Grandfather   . Diabetes Paternal Grandfather   . Glaucoma Paternal Grandfather   . Macular degeneration Paternal Grandfather   . Retinal detachment Paternal Grandfather   . Retinitis pigmentosa Paternal Grandfather     SOCIAL HISTORY Social History   Tobacco Use  . Smoking status: Never Smoker  . Smokeless tobacco: Never Used  Substance Use Topics  . Alcohol use: Yes  .  Drug use: No         OPHTHALMIC EXAM:  Base Eye Exam    Visual Acuity (Snellen - Linear)      Right Left   Dist cc 20/20 20/20 -1   Dist ph cc  20/20   Correction:  Glasses       Tonometry (Tonopen, 1:49 PM)      Right Left   Pressure 17 18       Pupils      Dark Light Shape React APD   Right 4 3.5 Irregular Minimal None   Left 2 1.5 Round Slow None       Visual Fields (Counting fingers)      Left Right    Full Full       Extraocular Movement      Right Left    Full, Ortho Full, Ortho        Neuro/Psych    Oriented x3:  Yes   Mood/Affect:  Normal       Dilation    Both eyes:  1.0% Mydriacyl, 2.5% Phenylephrine @ 1:49 PM        Slit Lamp and Fundus Exam    Slit Lamp Exam      Right Left   Lids/Lashes Dermatochalasis - upper lid Dermatochalasis - upper lid   Conjunctiva/Sclera White and quiet White and quiet   Cornea Mild Arcus, otherwise clear Mild Arcus, otherwise clear   Anterior Chamber Deep and quiet Deep and quiet   Iris Round and dilated Round and dilated   Lens 2+ Nuclear sclerosis, 2+ Cortical cataract 2+ Nuclear sclerosis, 2+ Cortical cataract   Vitreous Vitreous syneresis, Posterior vitreous detachment Vitreous syneresis, Posterior vitreous detachment       Fundus Exam      Right Left   Disc Pink and Sharp Pink and Sharp   C/D Ratio 0.5 0.5   Macula Flat, No heme or edema Good foveal reflex, mild Retinal pigment epithelial mottling, No heme or edema   Vessels Normal Normal   Periphery Attached, pigmented Lattice degeneration inferiorly at 0600, small patch of pigmented lattice at 0730, few dot hemes temporally, scattered peripheral cystoid degeneration, VR tuft at 0130 ora, VR tufts inferonasal quadrant Attached, peripheral cystoid degeneration at 0600, pigmented Lattice degeneration inferiorly, VR tuft at 0600, VR tuft at 0130, two VR tufts in superonasal quadrant ora        Refraction    Wearing Rx      Sphere Cylinder Axis Add   Right -5.75 +1.75 019 +2.50   Left -4.75 +2.00 001 +2.50          IMAGING AND PROCEDURES  Imaging and Procedures for 07/11/17  OCT, Retina - OU - Both Eyes       Right Eye Quality was good. Central Foveal Thickness: 267. Progression has no prior data. Findings include normal foveal contour, no IRF, no SRF.   Left Eye Quality was good. Central Foveal Thickness: 279. Progression has no prior data. Findings include normal foveal contour, no IRF, no SRF, vitreomacular adhesion .   Notes *Images captured and stored  on drive  Diagnosis / Impression:  NFP; no IRF/SRF OU  Clinical management:  See below  Abbreviations: NFP - Normal foveal profile. CME - cystoid macular edema. PED - pigment epithelial detachment. IRF - intraretinal fluid. SRF - subretinal fluid. EZ - ellipsoid zone. ERM - epiretinal membrane. ORA - outer retinal atrophy. ORT - outer retinal tubulation. SRHM - subretinal hyper-reflective material  ASSESSMENT/PLAN:    ICD-10-CM   1. 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 Peripheral cystic tuft Q14.1   4. Retinal edema H35.81 OCT, Retina - OU - Both Eyes  5. Combined forms of age-related cataract of both eyes H25.813     1. PVD / vitreous syneresis OU  Pt reports floaters and intermittent photopsias OD  Discussed findings and prognosis  No RT or RD on 360 scleral depressed exam OU  Reviewed s/s of RT/RD  Strict return precautions for any such RT/RD signs/symptoms  F/u in 3-4 wks  2,3. Lattice degeneration w/ vitreoretinal tufts OU - OD: pigmented lattice at 0600, 730, with VR tufts at 0130 ora and in inferonasal quadrant - OS: pigmented lattice inferiorly, VR tuft at 0600, 0130 and two VR tufts in superonasal quadrant ora - discussed findings, prognosis, and treatment options including observation - recommend observation for now -- pt in agreement - f/u in 3-4 wks, sooner prn  4. No retinal edema on exam or OCT  5. Combined form age-related cataract OU-  - The symptoms of cataract, surgical options, and treatments and risks were discussed with patient. - discussed diagnosis and progression - not yet visually significant - monitor for now    Ophthalmic Meds Ordered this visit:  No orders of the defined types were placed in this encounter.      Return in about 2 weeks (around 07/24/2017) for F/U PVD OU, Dilated exam, OCT.  There are no Patient Instructions on file for this  visit.   Explained the diagnoses, plan, and follow up with the patient and they expressed understanding.  Patient expressed understanding of the importance of proper follow up care.   This document serves as a record of services personally performed by Gardiner Sleeper, MD, PhD. It was created on their behalf by Catha Brow, [place], a certified ophthalmic assistant. The creation of this record is the provider's dictation and/or activities during the visit.  Electronically signed by: Catha Brow, [place]  07/11/17 12:12 PM   Gardiner Sleeper, M.D., Ph.D. Diseases & Surgery of the Retina and [place] 07/11/17  I have reviewed the above documentation for accuracy and completeness, and I agree with the above. Gardiner Sleeper, M.D., Ph.D. 07/11/17 12:12 PM     Abbreviations: M myopia (nearsighted); A astigmatism; H hyperopia (farsighted); P presbyopia; Mrx spectacle prescription;  CTL contact lenses; OD right eye; OS left eye; OU both eyes  XT exotropia; ET esotropia; PEK punctate epithelial keratitis; PEE punctate epithelial erosions; DES dry eye syndrome; MGD meibomian gland dysfunction; ATs artificial tears; PFAT's preservative free artificial tears; [place] nuclear sclerotic cataract; PSC posterior subcapsular cataract; ERM epi-retinal membrane; PVD posterior vitreous detachment; RD retinal detachment; DM diabetes mellitus; DR diabetic retinopathy; NPDR non-proliferative diabetic retinopathy; PDR proliferative diabetic retinopathy; CSME clinically significant macular edema; DME diabetic macular edema; dbh dot blot hemorrhages; CWS cotton wool spot; POAG primary open angle glaucoma; C/D cup-to-disc ratio; HVF humphrey visual field; GVF goldmann visual field; OCT optical coherence tomography; IOP intraocular pressure; BRVO Branch retinal vein occlusion; CRVO central retinal vein occlusion; CRAO central retinal artery occlusion; BRAO branch retinal artery occlusion; RT  retinal tear; SB scleral buckle; PPV pars plana vitrectomy; VH Vitreous hemorrhage; PRP panretinal laser photocoagulation; IVK intravitreal kenalog; VMT vitreomacular traction; MH Macular hole;  NVD neovascularization of the disc; NVE neovascularization elsewhere; AREDS age related eye disease study; ARMD age  related macular degeneration; POAG primary open angle glaucoma; EBMD epithelial/anterior basement membrane dystrophy; ACIOL anterior chamber intraocular lens; IOL intraocular lens; PCIOL posterior chamber intraocular lens; Phaco/IOL phacoemulsification with intraocular lens placement; [place] photorefractive keratectomy; LASIK laser assisted in situ keratomileusis; HTN hypertension; DM diabetes mellitus; COPD chronic obstructive pulmonary disease

## 2017-07-10 ENCOUNTER — Ambulatory Visit (INDEPENDENT_AMBULATORY_CARE_PROVIDER_SITE_OTHER): Payer: BLUE CROSS/BLUE SHIELD | Admitting: Ophthalmology

## 2017-07-10 ENCOUNTER — Encounter (INDEPENDENT_AMBULATORY_CARE_PROVIDER_SITE_OTHER): Payer: Self-pay | Admitting: Ophthalmology

## 2017-07-10 DIAGNOSIS — H3581 Retinal edema: Secondary | ICD-10-CM | POA: Diagnosis not present

## 2017-07-10 DIAGNOSIS — Q141 Congenital malformation of retina: Secondary | ICD-10-CM | POA: Diagnosis not present

## 2017-07-10 DIAGNOSIS — H35413 Lattice degeneration of retina, bilateral: Secondary | ICD-10-CM

## 2017-07-10 DIAGNOSIS — H43813 Vitreous degeneration, bilateral: Secondary | ICD-10-CM | POA: Diagnosis not present

## 2017-07-10 DIAGNOSIS — H25813 Combined forms of age-related cataract, bilateral: Secondary | ICD-10-CM | POA: Diagnosis not present

## 2017-07-11 ENCOUNTER — Encounter (INDEPENDENT_AMBULATORY_CARE_PROVIDER_SITE_OTHER): Payer: Self-pay | Admitting: Ophthalmology

## 2017-08-06 ENCOUNTER — Encounter (INDEPENDENT_AMBULATORY_CARE_PROVIDER_SITE_OTHER): Payer: BLUE CROSS/BLUE SHIELD | Admitting: Ophthalmology

## 2017-08-21 NOTE — Progress Notes (Addendum)
[facility] Note  08/22/2017     CHIEF COMPLAINT Patient presents for Retina Follow Up   HISTORY OF PRESENT ILLNESS: Robert Nelson is a 62 y.o. male who presents to the clinic today for:   HPI    Retina Follow Up    Patient presents with  PVD.  In both eyes.  This started 2 weeks ago.  Severity is mild.  Since onset it is stable.  I, the attending physician,  performed the HPI with the patient and updated documentation appropriately.          Comments    F/U PVD OD. Patient states" depends on light condition as when he notices the flashes and floaters, they seem to be not as often". Denies ocular pain .       Last edited by Bernarda Caffey, MD on 08/22/2017  8:33 AM. (History)      Referring physician: Gaynelle Arabian, MD [address], [place] [address]  HISTORICAL INFORMATION:   Selected notes from the MEDICAL RECORD NUMBER Referred by Dr. Read Drivers for concern of persistent photopsia;  LEE- 04.02.19 (C. Weaver) [BCVA OD: 20/20 OS: 20/20-2] Ocular Hx- cataract OU, glaucoma suspect OU, DES OU, s/p strabismus sx (01.01.1962) PMH-     CURRENT MEDICATIONS: No current outpatient medications on file. (Ophthalmic Drugs)   No current facility-administered medications for this visit.  (Ophthalmic Drugs)   Current Outpatient Medications (Other)  Medication Sig  . ibuprofen (ADVIL,MOTRIN) 200 MG tablet Take 200 mg by mouth every 6 (six) hours as needed.   No current facility-administered medications for this visit.  (Other)      REVIEW OF SYSTEMS: ROS    Positive for: Eyes   Negative for: Constitutional, Gastrointestinal, Neurological, Skin, Genitourinary, Musculoskeletal, HENT, Endocrine, Cardiovascular, Respiratory, Psychiatric, Allergic/Imm, Heme/Lymph   Last edited by Zenovia Jordan, LPN on 09/06/4313  4:00 AM. (History)       ALLERGIES No Known Allergies  PAST MEDICAL HISTORY Past Medical History:   Diagnosis Date  . Diverticulosis   . Numbness   . Vertigo    Past Surgical History:  Procedure Laterality Date  . arm surgery    . EYE SURGERY      FAMILY HISTORY Family History  Problem Relation Age of Onset  . Cataracts Maternal Grandfather   . Strabismus Paternal Grandmother   . Cataracts Paternal Grandfather   . Amblyopia Paternal Grandfather   . Blindness Paternal Grandfather   . Diabetes Paternal Grandfather   . Glaucoma Paternal Grandfather   . Macular degeneration Paternal Grandfather   . Retinal detachment Paternal Grandfather   . Retinitis pigmentosa Paternal Grandfather     SOCIAL HISTORY Social History   Tobacco Use  . Smoking status: Never Smoker  . Smokeless tobacco: Never Used  Substance Use Topics  . Alcohol use: Yes  . Drug use: No         OPHTHALMIC EXAM:  Base Eye Exam    Visual Acuity (Snellen - Linear)      Right Left   Dist cc 20/20 20/25 +1   Dist ph cc NI NI   Correction:  Glasses       Tonometry (Tonopen, 8:23 AM)      Right Left   Pressure 17 18       Pupils      Dark Light Shape React APD   Right 4 3 Round Minimal None   Left 3 2 Round Minimal  None       Visual Fields (Counting fingers)      Left Right    Full Full       Extraocular Movement      Right Left    Ortho Ortho    -- -- --  --  --  -- -- --   -- -- --  --  --  -- -- --         Neuro/Psych    Oriented x3:  Yes   Mood/Affect:  Normal       Dilation    Both eyes:  1.0% Mydriacyl, Paremyd @ 8:24 AM        Slit Lamp and Fundus Exam    Slit Lamp Exam      Right Left   Lids/Lashes Dermatochalasis - upper lid Dermatochalasis - upper lid   Conjunctiva/Sclera White and quiet White and quiet   Cornea Mild Arcus, otherwise clear Mild Arcus, otherwise clear   Anterior Chamber Deep and quiet Deep and quiet   Iris Round and dilated Round and dilated   Lens 2+ Nuclear sclerosis, 2+ Cortical cataract 2+ Nuclear sclerosis, 2+ Cortical cataract    Vitreous Vitreous syneresis, Posterior vitreous detachment Vitreous syneresis, Posterior vitreous detachment       Fundus Exam      Right Left   Disc Pink and Sharp Pink and Sharp   C/D Ratio 0.5 0.5   Macula Flat, No heme or edema Good foveal reflex, mild Retinal pigment epithelial mottling, No heme or edema   Vessels Normal Normal   Periphery Attached, pigmented Lattice degeneration inferiorly w/ VR tuft / linear tear at 0600, small patch of pigmented lattice at 0730, dot hemes temporally resolved, scattered peripheral cystoid degeneration, VR tuft at 0130 ora, VR tufts inferonasal quadrant Attached, peripheral cystoid degeneration at 0600, pigmented Lattice degeneration inferiorly, VR tufts / linear retinal microtears at 0600, VR tuft at 0130, two VR tufts in superonasal quadrant ora          IMAGING AND PROCEDURES  Imaging and Procedures for 07/11/17  OCT, Retina - OU - Both Eyes       Right Eye Quality was good. Central Foveal Thickness: 271. Progression has been stable. Findings include normal foveal contour, no IRF, no SRF.   Left Eye Quality was good. Central Foveal Thickness: 275. Progression has been stable. Findings include normal foveal contour, no IRF, no SRF, vitreomacular adhesion .   Notes *Images captured and stored on drive  Diagnosis / Impression:  NFP; no IRF/SRF OU  Clinical management:  See below  Abbreviations: NFP - Normal foveal profile. CME - cystoid macular edema. PED - pigment epithelial detachment. IRF - intraretinal fluid. SRF - subretinal fluid. EZ - ellipsoid zone. ERM - epiretinal membrane. ORA - outer retinal atrophy. ORT - outer retinal tubulation. SRHM - subretinal hyper-reflective material                 ASSESSMENT/PLAN:    ICD-10-CM   1. 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 Retinal tear of both eyes H33.313   3. Posterior vitreous detachment of both eyes H43.813   4. Retinal edema H35.81 OCT, Retina - OU -  Both Eyes  5. Combined forms of age-related cataract of both eyes H25.813     1,2. Lattice degeneration w/ vitreoretinal tufts / linear retinal tears OU - OD: pigmented lattice at 0600, 730, with linear retinal break at 0600; VR tufts at 0130 ora and in inferonasal  quadrant - OS: pigmented lattice inferiorly, VR tufts/linear retinal tears at 0600; VR tuft at 0130 and two VR tufts in superonasal quadrant ora - discussed findings, prognosis, and treatment options including observation - pt wishes to have laser retinopexy OU - f/u in 3-4 wks for laser retinopexy OS first  3. PVD / vitreous syneresis OU  Pt reports floaters and intermittent photopsias OD  Discussed findings and prognosis  No RT or RD on 360 scleral depressed exam OU  Reviewed s/s of RT/RD  Strict return precautions for any such RT/RD signs/symptoms  F/u in 3-4 wks  4. No retinal edema on exam or OCT  5. Combined form age-related cataract OU-  - The symptoms of cataract, surgical options, and treatments and risks were discussed with patient. - discussed diagnosis and progression - not yet visually significant - monitor for now    Ophthalmic Meds Ordered this visit:  No orders of the defined types were placed in this encounter.      Return in about 4 weeks (around 09/17/2017) for Laser OS.  There are no Patient Instructions on file for this visit.   Explained the diagnoses, plan, and follow up with the patient and they expressed understanding.  Patient expressed understanding of the importance of proper follow up care.   This document serves as a record of services personally performed by Gardiner Sleeper, MD, PhD. It was created on their behalf by Catha Brow, [place], a certified ophthalmic assistant. The creation of this record is the provider's dictation and/or activities during the visit.  Electronically signed by: Catha Brow, [place]  05.29.19 12:48 AM   Gardiner Sleeper, M.D., Ph.D. Diseases & Surgery  of the Retina and Vitreous [facility] 08/22/17  I have reviewed the above documentation for accuracy and completeness, and I agree with the above. Gardiner Sleeper, M.D., Ph.D. 08/23/17 12:48 AM     Abbreviations: M myopia (nearsighted); A astigmatism; H hyperopia (farsighted); P presbyopia; Mrx spectacle prescription;  CTL contact lenses; OD right eye; OS left eye; OU both eyes  XT exotropia; ET esotropia; PEK punctate epithelial keratitis; PEE punctate epithelial erosions; DES dry eye syndrome; MGD meibomian gland dysfunction; ATs artificial tears; PFAT's preservative free artificial tears; [place] nuclear sclerotic cataract; PSC posterior subcapsular cataract; ERM epi-retinal membrane; PVD posterior vitreous detachment; RD retinal detachment; DM diabetes mellitus; DR diabetic retinopathy; NPDR non-proliferative diabetic retinopathy; PDR proliferative diabetic retinopathy; CSME clinically significant macular edema; DME diabetic macular edema; dbh dot blot hemorrhages; CWS cotton wool spot; POAG primary open angle glaucoma; C/D cup-to-disc ratio; HVF humphrey visual field; GVF goldmann visual field; OCT optical coherence tomography; IOP intraocular pressure; BRVO Branch retinal vein occlusion; CRVO central retinal vein occlusion; CRAO central retinal artery occlusion; BRAO branch retinal artery occlusion; RT retinal tear; SB scleral buckle; PPV pars plana vitrectomy; VH Vitreous hemorrhage; PRP panretinal laser photocoagulation; IVK intravitreal kenalog; VMT vitreomacular traction; MH Macular hole;  NVD neovascularization of the disc; NVE neovascularization elsewhere; AREDS age related eye disease study; ARMD age related macular degeneration; POAG primary open angle glaucoma; EBMD epithelial/anterior basement membrane dystrophy; ACIOL anterior chamber intraocular lens; IOL intraocular lens; PCIOL posterior chamber intraocular lens; Phaco/IOL phacoemulsification with intraocular lens  placement; [place] photorefractive keratectomy; LASIK laser assisted in situ keratomileusis; HTN hypertension; DM diabetes mellitus; COPD chronic obstructive pulmonary disease

## 2017-08-22 ENCOUNTER — Ambulatory Visit (INDEPENDENT_AMBULATORY_CARE_PROVIDER_SITE_OTHER): Payer: BLUE CROSS/BLUE SHIELD | Admitting: Ophthalmology

## 2017-08-22 ENCOUNTER — Encounter (INDEPENDENT_AMBULATORY_CARE_PROVIDER_SITE_OTHER): Payer: Self-pay | Admitting: Ophthalmology

## 2017-08-22 DIAGNOSIS — H33313 Horseshoe tear of retina without detachment, bilateral: Secondary | ICD-10-CM

## 2017-08-22 DIAGNOSIS — H25813 Combined forms of age-related cataract, bilateral: Secondary | ICD-10-CM | POA: Diagnosis not present

## 2017-08-22 DIAGNOSIS — H43813 Vitreous degeneration, bilateral: Secondary | ICD-10-CM | POA: Diagnosis not present

## 2017-08-22 DIAGNOSIS — H35413 Lattice degeneration of retina, bilateral: Secondary | ICD-10-CM

## 2017-08-22 DIAGNOSIS — H3581 Retinal edema: Secondary | ICD-10-CM

## 2017-08-23 ENCOUNTER — Encounter (INDEPENDENT_AMBULATORY_CARE_PROVIDER_SITE_OTHER): Payer: Self-pay | Admitting: Ophthalmology

## 2017-09-13 NOTE — Progress Notes (Signed)
[facility] Note  09/17/2017     CHIEF COMPLAINT Patient presents for Retina Follow Up   HISTORY OF PRESENT ILLNESS: Robert Nelson is a 62 y.o. male who presents to the clinic today for:   HPI    Retina Follow Up    Patient presents with  PVD.  In right eye.  This started 2 months ago.  Severity is mild.  Since onset it is stable.  I, the attending physician,  performed the HPI with the patient and updated documentation appropriately.          Comments    F/U PVD OD. Patient states the flashes and floaters have decreased or "I just don't notice them as much anymore", vision is about the same, no new visual onsets.        Last edited by Bernarda Caffey, MD on 09/17/2017  3:12 PM. (History)      Referring physician: Gaynelle Arabian, MD [address], [place] [address]  HISTORICAL INFORMATION:   Selected notes from the MEDICAL RECORD NUMBER Referred by Dr. Read Drivers for concern of persistent photopsia;  LEE- 04.02.19 (C. Weaver) [BCVA OD: 20/20 OS: 20/20-2] Ocular Hx- cataract OU, glaucoma suspect OU, DES OU, s/p strabismus sx ([birth year].01.1962) PMH-     CURRENT MEDICATIONS: Current Outpatient Medications (Ophthalmic Drugs)  Medication Sig  . prednisoLONE acetate (PRED FORTE) 1 % ophthalmic suspension Place 1 drop into the left eye 4 (four) times daily for 7 days.   No current facility-administered medications for this visit.  (Ophthalmic Drugs)   Current Outpatient Medications (Other)  Medication Sig  . ibuprofen (ADVIL,MOTRIN) 200 MG tablet Take 200 mg by mouth every 6 (six) hours as needed.   No current facility-administered medications for this visit.  (Other)      REVIEW OF SYSTEMS: ROS    Positive for: Eyes   Negative for: Constitutional, Gastrointestinal, Neurological, Skin, Genitourinary, Musculoskeletal, HENT, Endocrine, Cardiovascular, Respiratory, Psychiatric, Allergic/Imm, Heme/Lymph   Last edited by  Zenovia Jordan, LPN on 09/26/5007  3:81 PM. (History)       ALLERGIES No Known Allergies  PAST MEDICAL HISTORY Past Medical History:  Diagnosis Date  . Diverticulosis   . Numbness   . Vertigo    Past Surgical History:  Procedure Laterality Date  . arm surgery    . EYE SURGERY      FAMILY HISTORY Family History  Problem Relation Age of Onset  . Cataracts Maternal Grandfather   . Strabismus Paternal Grandmother   . Cataracts Paternal Grandfather   . Amblyopia Paternal Grandfather   . Blindness Paternal Grandfather   . Diabetes Paternal Grandfather   . Glaucoma Paternal Grandfather   . Macular degeneration Paternal Grandfather   . Retinal detachment Paternal Grandfather   . Retinitis pigmentosa Paternal Grandfather     SOCIAL HISTORY Social History   Tobacco Use  . Smoking status: Never Smoker  . Smokeless tobacco: Never Used  Substance Use Topics  . Alcohol use: Yes  . Drug use: No         OPHTHALMIC EXAM:  Base Eye Exam    Visual Acuity (Snellen - Linear)      Right Left   Dist cc 20/25 +2 20/25 -1   Dist ph cc 20/20 -1 20/25 +1   Correction:  Glasses       Tonometry (Tonopen, 2:15 PM)      Right Left   Pressure 16 18  Pupils      Dark Light Shape React APD   Right 4 4 Irregular NR None   Left 3 2 Round Slow None       Visual Fields (Counting fingers)      Left Right    Full Full       Extraocular Movement      Right Left    Full, Ortho Full, Ortho       Neuro/Psych    Oriented x3:  Yes   Mood/Affect:  Normal       Dilation    Both eyes:  1.0% Mydriacyl, 2.5% Phenylephrine @ 2:15 PM        Slit Lamp and Fundus Exam    Slit Lamp Exam      Right Left   Lids/Lashes Dermatochalasis - upper lid Dermatochalasis - upper lid   Conjunctiva/Sclera White and quiet White and quiet   Cornea Mild Arcus, otherwise clear Mild Arcus, otherwise clear   Anterior Chamber Deep and quiet Deep and quiet   Iris Round and dilated Round and  dilated   Lens 2+ Nuclear sclerosis, 2+ Cortical cataract 2+ Nuclear sclerosis, 2+ Cortical cataract   Vitreous Vitreous syneresis, Posterior vitreous detachment Vitreous syneresis, Posterior vitreous detachment       Fundus Exam      Right Left   Disc Pink and Sharp Pink and Sharp   C/D Ratio 0.5 0.5   Macula Flat, No heme or edema Good foveal reflex, mild Retinal pigment epithelial mottling, No heme or edema   Vessels Normal Normal   Periphery Attached, pigmented Lattice degeneration inferiorly w/ VR tuft / linear tear at 0600, small patch of pigmented lattice at 0730, dot hemes temporally resolved, scattered peripheral cystoid degeneration, VR tuft at 0130 ora, VR tufts inferonasal quadrant Attached, peripheral cystoid degeneration at 0600, pigmented Lattice degeneration inferiorly, VR tufts / linear retinal microtears at 0600, VR tuft at 0130, two VR tufts in superonasal quadrant ora          IMAGING AND PROCEDURES  Imaging and Procedures for 07/11/17  Repair Retinal Breaks, Laser - OS - Left Eye       LASER PROCEDURE NOTE  Procedure:  Barrier laser retinopexy using laser indirect ophthalmoscope, LEFT eye   Diagnosis:   Lattice degeneration w/ retinal tears, LEFT eye                     microtears at 0130, 0600, and 1030 o'clock anterior to equator   Surgeon: Bernarda Caffey, MD, PhD  Anesthesia: Topical  Informed consent obtained, operative eye marked, and time out performed prior to initiation of laser.   Laser settings:  Lumenis [id number] laser indirect ophthalmoscope Power: 240 mW Duration: 100 msec  # spots: 810  Placement of laser: Laser was placed in three confluent rows around segments of lattice degeneration w/ retinal microtears at 0130, 0600, and 1030 oclock ora  Complications: None.  Patient tolerated the procedure well and received written and verbal post-procedure care information/education.                 ASSESSMENT/PLAN:    ICD-10-CM   1.  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 Retinal tear of both eyes H33.313 Repair Retinal Breaks, Laser - OS - Left Eye  3. Posterior vitreous detachment of both eyes H43.813   4. Retinal edema H35.81   5. Combined forms of age-related cataract of both eyes  H25.813     1,2. Lattice degeneration w/ vitreoretinal tufts / linear retinal tears OU - OD: pigmented lattice at 0600, 730, with linear retinal break at 0600; VR tufts at 0130 ora and in inferonasal quadrant - OS: pigmented lattice inferiorly, VR tufts/linear retinal tears at 0600; VR tuft at 0130 and two VR tufts in superonasal quadrant ora - discussed findings, prognosis, and treatment options including observation - recommend laser retinopexy OU, OS first - pt wishes to proceed with laser retinopexy OS today (06.25.19) - RBA of procedure discussed, questions answered - informed consent obtained and signed - see procedure note - start PF OS QID x 7 days - f/u in 1-2 wks for laser retinopexy OD  3. PVD / vitreous syneresis OU  Pt reports floaters and intermittent photopsias OD  Discussed findings and prognosis  No RT or RD on 360 scleral depressed exam OU  Reviewed s/s of RT/RD  Strict return precautions for any such RT/RD signs/symptoms  F/u in 3-4 wks  4. No retinal edema on exam or OCT  5. Combined form age-related cataract OU-  - The symptoms of cataract, surgical options, and treatments and risks were discussed with patient. - discussed diagnosis and progression - not yet visually significant - monitor for now    Ophthalmic Meds Ordered this visit:  Meds ordered this encounter  Medications  . prednisoLONE acetate (PRED FORTE) 1 % ophthalmic suspension    Sig: Place 1 drop into the left eye 4 (four) times daily for 7 days.    Dispense:  10 mL    Refill:  0       Return in about 2 weeks (around 10/01/2017) for POV laser retinopexy OS.  There are no Patient  Instructions on file for this visit.   Explained the diagnoses, plan, and follow up with the patient and they expressed understanding.  Patient expressed understanding of the importance of proper follow up care.   This document serves as a record of services personally performed by Gardiner Sleeper, MD, PhD. It was created on their behalf by Catha Brow, [place], a certified ophthalmic assistant. The creation of this record is the provider's dictation and/or activities during the visit.  Electronically signed by: Catha Brow, [place]  06.21.19 8:30 AM   Gardiner Sleeper, M.D., Ph.D. Diseases & Surgery of the Retina and Vitreous [facility]  I have reviewed the above documentation for accuracy and completeness, and I agree with the above. Gardiner Sleeper, M.D., Ph.D. 09/18/17 8:30 AM    Abbreviations: M myopia (nearsighted); A astigmatism; H hyperopia (farsighted); P presbyopia; Mrx spectacle prescription;  CTL contact lenses; OD right eye; OS left eye; OU both eyes  XT exotropia; ET esotropia; PEK punctate epithelial keratitis; PEE punctate epithelial erosions; DES dry eye syndrome; MGD meibomian gland dysfunction; ATs artificial tears; PFAT's preservative free artificial tears; [place] nuclear sclerotic cataract; PSC posterior subcapsular cataract; ERM epi-retinal membrane; PVD posterior vitreous detachment; RD retinal detachment; DM diabetes mellitus; DR diabetic retinopathy; NPDR non-proliferative diabetic retinopathy; PDR proliferative diabetic retinopathy; CSME clinically significant macular edema; DME diabetic macular edema; dbh dot blot hemorrhages; CWS cotton wool spot; POAG primary open angle glaucoma; C/D cup-to-disc ratio; HVF humphrey visual field; GVF goldmann visual field; OCT optical coherence tomography; IOP intraocular pressure; BRVO Branch retinal vein occlusion; CRVO central retinal vein occlusion; CRAO central retinal artery occlusion; BRAO branch retinal  artery occlusion; RT retinal tear; SB scleral buckle; PPV pars plana vitrectomy; VH Vitreous hemorrhage;  PRP panretinal laser photocoagulation; IVK intravitreal kenalog; VMT vitreomacular traction; MH Macular hole;  NVD neovascularization of the disc; NVE neovascularization elsewhere; AREDS age related eye disease study; ARMD age related macular degeneration; POAG primary open angle glaucoma; EBMD epithelial/anterior basement membrane dystrophy; ACIOL anterior chamber intraocular lens; IOL intraocular lens; PCIOL posterior chamber intraocular lens; Phaco/IOL phacoemulsification with intraocular lens placement; [place] photorefractive keratectomy; LASIK laser assisted in situ keratomileusis; HTN hypertension; DM diabetes mellitus; COPD chronic obstructive pulmonary disease

## 2017-09-17 ENCOUNTER — Encounter (INDEPENDENT_AMBULATORY_CARE_PROVIDER_SITE_OTHER): Payer: Self-pay | Admitting: Ophthalmology

## 2017-09-17 ENCOUNTER — Ambulatory Visit (INDEPENDENT_AMBULATORY_CARE_PROVIDER_SITE_OTHER): Payer: BLUE CROSS/BLUE SHIELD | Admitting: Ophthalmology

## 2017-09-17 DIAGNOSIS — H33313 Horseshoe tear of retina without detachment, bilateral: Secondary | ICD-10-CM | POA: Diagnosis not present

## 2017-09-17 DIAGNOSIS — H35413 Lattice degeneration of retina, bilateral: Secondary | ICD-10-CM

## 2017-09-17 DIAGNOSIS — H43813 Vitreous degeneration, bilateral: Secondary | ICD-10-CM

## 2017-09-17 DIAGNOSIS — H3581 Retinal edema: Secondary | ICD-10-CM

## 2017-09-17 DIAGNOSIS — H25813 Combined forms of age-related cataract, bilateral: Secondary | ICD-10-CM

## 2017-09-17 MED ORDER — PREDNISOLONE ACETATE 1 % OP SUSP: 1.0000 [drp] | Freq: Four times a day (QID) | OPHTHALMIC | 0 refills | Status: AC

## 2017-09-18 ENCOUNTER — Encounter (INDEPENDENT_AMBULATORY_CARE_PROVIDER_SITE_OTHER): Payer: Self-pay | Admitting: Ophthalmology

## 2017-09-30 NOTE — Progress Notes (Signed)
[facility] Note  10/01/2017     CHIEF COMPLAINT Patient presents for Retina Follow Up   HISTORY OF PRESENT ILLNESS: Robert Nelson is a 62 y.o. male who presents to the clinic today for:   HPI    Retina Follow Up    Patient presents with  Other.  In both eyes.  Severity is moderate.  Duration of 2 weeks.  Since onset it is stable.  I, the attending physician,  performed the HPI with the patient and updated documentation appropriately.          Comments    Pt presents for lattice degeneration F/U, pt states VA is stable since last visit, states he had a few floaters after laser last time, but they have since gone away, pt states he is experiencing flashes of light OD only, but denies wavy vision or pain, pt states compliance with gtts and d/c them after 1 week,        Last edited by Bernarda Caffey, MD on 10/01/2017  2:43 PM. (History)    Pt states   Referring physician: Gaynelle Arabian, MD [address], [place] [address]  HISTORICAL INFORMATION:   Selected notes from the MEDICAL RECORD NUMBER Referred by Dr. Read Drivers for concern of persistent photopsia;  LEE- 04.02.19 (C. Weaver) [BCVA OD: 20/20 OS: 20/20-2] Ocular Hx- cataract OU, glaucoma suspect OU, DES OU, s/p strabismus sx (01.01.1962) PMH-     CURRENT MEDICATIONS: No current outpatient medications on file. (Ophthalmic Drugs)   No current facility-administered medications for this visit.  (Ophthalmic Drugs)   Current Outpatient Medications (Other)  Medication Sig  . ibuprofen (ADVIL,MOTRIN) 200 MG tablet Take 200 mg by mouth every 6 (six) hours as needed.   No current facility-administered medications for this visit.  (Other)      REVIEW OF SYSTEMS: ROS    Positive for: Eyes   Negative for: Constitutional, Gastrointestinal, Neurological, Skin, Genitourinary, Musculoskeletal, HENT, Endocrine, Cardiovascular, Respiratory, Psychiatric, Allergic/Imm, Heme/Lymph    Last edited by Debbrah Alar, COT on 10/01/2017  2:32 PM. (History)       ALLERGIES No Known Allergies  PAST MEDICAL HISTORY Past Medical History:  Diagnosis Date  . Diverticulosis   . Numbness   . Vertigo    Past Surgical History:  Procedure Laterality Date  . arm surgery    . EYE SURGERY      FAMILY HISTORY Family History  Problem Relation Age of Onset  . Cataracts Maternal Grandfather   . Strabismus Paternal Grandmother   . Cataracts Paternal Grandfather   . Amblyopia Paternal Grandfather   . Blindness Paternal Grandfather   . Diabetes Paternal Grandfather   . Glaucoma Paternal Grandfather   . Macular degeneration Paternal Grandfather   . Retinal detachment Paternal Grandfather   . Retinitis pigmentosa Paternal Grandfather     SOCIAL HISTORY Social History   Tobacco Use  . Smoking status: Never Smoker  . Smokeless tobacco: Never Used  Substance Use Topics  . Alcohol use: Yes  . Drug use: No         OPHTHALMIC EXAM:  Base Eye Exam    Visual Acuity (Snellen - Linear)      Right Left   Dist cc 20/25 -2 20/25   Dist ph cc 20/20 20/25 +2   Correction:  Glasses       Tonometry (Tonopen, 2:37 PM)      Right Left   Pressure 17  13       Pupils      Dark Light Shape React APD   Right 4 3 Irregular Slow None   Left 4 3 Round Brisk None       Visual Fields (Counting fingers)      Left Right    Full Full       Extraocular Movement      Right Left    Full, Ortho Full, Ortho       Neuro/Psych    Oriented x3:  Yes   Mood/Affect:  Normal       Dilation    Both eyes:  1.0% Mydriacyl, 2.5% Phenylephrine @ 2:36 PM        Slit Lamp and Fundus Exam    Slit Lamp Exam      Right Left   Lids/Lashes Dermatochalasis - upper lid Dermatochalasis - upper lid   Conjunctiva/Sclera White and quiet White and quiet   Cornea Mild Arcus, otherwise clear Mild Arcus, otherwise clear   Anterior Chamber Deep and quiet Deep and quiet   Iris Round and  dilated Round and dilated   Lens 2+ Nuclear sclerosis, 2+ Cortical cataract 2+ Nuclear sclerosis, 2+ Cortical cataract   Vitreous Vitreous syneresis, Posterior vitreous detachment Vitreous syneresis, Posterior vitreous detachment       Fundus Exam      Right Left   Disc Pink and Sharp Pink and Sharp   C/D Ratio 0.5 0.5   Macula Flat, No heme or edema Good foveal reflex, mild Retinal pigment epithelial mottling, No heme or edema   Vessels Mild Vascular attenuation Normal   Periphery Attached, pigmented Lattice degeneration inferiorly w/ VR tuft / linear tear at 0600, small patch of pigmented lattice/peripheral cystoid degen temporally from 0730 - 1000, VR tuft at 0130 ora Attached, peripheral cystoid degeneration at 0600, pigmented Lattice degeneration inferiorly, VR tufts / linear retinal microtears at 0600, VR tuft at 0130, two VR tufts in superonasal quadrant ora -- good laser surrounding all lesions          IMAGING AND PROCEDURES  Imaging and Procedures for 07/11/17  Repair Retinal Breaks, Laser - OD - Right Eye       LASER PROCEDURE NOTE  Procedure:  Barrier laser retinopexy using laser indirect ophthalmoscope, RIGHT eye   Diagnosis:   Linear retinal tears/tufts, RIGHT eye                     0130 and 0600 o'clock ora  Surgeon: Bernarda Caffey, MD, PhD  Anesthesia: Topical, subconjunctival block, retrobulbar block  Informed consent obtained, operative eye marked, and time out performed prior to initiation of laser.   Laser settings:  Lumenis [id number] laser indirect ophthalmoscope Power: 260 mW Duration: 100 msec  # spots: 360  Placement of laser: Laser was placed in three confluent rows around linear tears / tufts at 0130 and 0600 oclock ora.  Complications: None.  Patient tolerated the procedure well and received written and verbal post-procedure care information/education.                 ASSESSMENT/PLAN:    ICD-10-CM   1. Bilateral retinal lattice  degeneration H35.413 Repair Retinal Breaks, Laser - OD - Right Eye  2. Retinal tear of both eyes H33.313 Repair Retinal Breaks, Laser - OD - Right Eye  3. Posterior vitreous detachment of both eyes H43.813   4. Retinal edema H35.81   5. Combined forms of age-related cataract of both eyes  H25.813   6. Peripheral cystic tuft Q14.1     1,2. Lattice degeneration w/ vitreoretinal tufts / linear retinal tears OU - OD: pigmented lattice at 0600, 0730, with linear retinal breaks/VR tufts at 0130 and 0600 - OS: pigmented lattice inferiorly, VR tufts/linear retinal tears at 0600; VR tuft at 0130 and two VR tufts in superonasal quadrant ora - S/P laser retinopexy OS (06.25.19) -- good laser changes in place - discussed findings, prognosis, and treatment options including observation - recommend laser retinopexy OD for tears at 0130 and 0600 today - pt wishes to proceed with laser retinopexy OD today (07.09.19) - RBA of procedure discussed, questions answered - informed consent obtained and signed - see procedure note - start PF OD QID x 7 days - f/u 2-3 wks  3. PVD / vitreous syneresis OU  Pt reports floaters and intermittent photopsias OD  Discussed findings and prognosis  No RT or RD on 360 scleral depressed exam OU  Reviewed s/s of RT/RD  Strict return precautions for any such RT/RD signs/symptoms  4. No retinal edema on exam or OCT  5. Combined form age-related cataract OU-  - The symptoms of cataract, surgical options, and treatments and risks were discussed with patient. - discussed diagnosis and progression - not yet visually significant - monitor for now    Ophthalmic Meds Ordered this visit:  No orders of the defined types were placed in this encounter.      Return in about 3 weeks (around 10/22/2017) for POV.  There are no Patient Instructions on file for this visit.   Explained the diagnoses, plan, and follow up with the patient and they expressed understanding.   Patient expressed understanding of the importance of proper follow up care.   This document serves as a record of services personally performed by Gardiner Sleeper, MD, PhD. It was created on their behalf by Ernest Mallick, OA, an ophthalmic assistant. The creation of this record is the provider's dictation and/or activities during the visit.    Electronically signed by: Ernest Mallick, OA  07.08.2019 3:54 PM    Gardiner Sleeper, M.D., Ph.D. Diseases & Surgery of the Retina and Vitreous [facility]  I have reviewed the above documentation for accuracy and completeness, and I agree with the above. Gardiner Sleeper, M.D., Ph.D. 10/01/17 3:56 PM     Abbreviations: M myopia (nearsighted); A astigmatism; H hyperopia (farsighted); P presbyopia; Mrx spectacle prescription;  CTL contact lenses; OD right eye; OS left eye; OU both eyes  XT exotropia; ET esotropia; PEK punctate epithelial keratitis; PEE punctate epithelial erosions; DES dry eye syndrome; MGD meibomian gland dysfunction; ATs artificial tears; PFAT's preservative free artificial tears; [place] nuclear sclerotic cataract; PSC posterior subcapsular cataract; ERM epi-retinal membrane; PVD posterior vitreous detachment; RD retinal detachment; DM diabetes mellitus; DR diabetic retinopathy; NPDR non-proliferative diabetic retinopathy; PDR proliferative diabetic retinopathy; CSME clinically significant macular edema; DME diabetic macular edema; dbh dot blot hemorrhages; CWS cotton wool spot; POAG primary open angle glaucoma; C/D cup-to-disc ratio; HVF humphrey visual field; GVF goldmann visual field; OCT optical coherence tomography; IOP intraocular pressure; BRVO Branch retinal vein occlusion; CRVO central retinal vein occlusion; CRAO central retinal artery occlusion; BRAO branch retinal artery occlusion; RT retinal tear; SB scleral buckle; PPV pars plana vitrectomy; VH Vitreous hemorrhage; PRP panretinal laser photocoagulation; IVK  intravitreal kenalog; VMT vitreomacular traction; MH Macular hole;  NVD neovascularization of the disc; NVE neovascularization elsewhere; AREDS age related eye disease study; ARMD  age related macular degeneration; POAG primary open angle glaucoma; EBMD epithelial/anterior basement membrane dystrophy; ACIOL anterior chamber intraocular lens; IOL intraocular lens; PCIOL posterior chamber intraocular lens; Phaco/IOL phacoemulsification with intraocular lens placement; [place] photorefractive keratectomy; LASIK laser assisted in situ keratomileusis; HTN hypertension; DM diabetes mellitus; COPD chronic obstructive pulmonary disease

## 2017-10-01 ENCOUNTER — Encounter (INDEPENDENT_AMBULATORY_CARE_PROVIDER_SITE_OTHER): Payer: Self-pay | Admitting: Ophthalmology

## 2017-10-01 ENCOUNTER — Ambulatory Visit (INDEPENDENT_AMBULATORY_CARE_PROVIDER_SITE_OTHER): Payer: BLUE CROSS/BLUE SHIELD | Admitting: Ophthalmology

## 2017-10-01 DIAGNOSIS — H25813 Combined forms of age-related cataract, bilateral: Secondary | ICD-10-CM

## 2017-10-01 DIAGNOSIS — H3581 Retinal edema: Secondary | ICD-10-CM

## 2017-10-01 DIAGNOSIS — H33313 Horseshoe tear of retina without detachment, bilateral: Secondary | ICD-10-CM | POA: Diagnosis not present

## 2017-10-01 DIAGNOSIS — H35413 Lattice degeneration of retina, bilateral: Secondary | ICD-10-CM

## 2017-10-01 DIAGNOSIS — H43813 Vitreous degeneration, bilateral: Secondary | ICD-10-CM

## 2017-10-14 NOTE — Progress Notes (Signed)
[facility] Note  10/15/2017     CHIEF COMPLAINT Patient presents for Retina Follow Up   HISTORY OF PRESENT ILLNESS: Robert Nelson is a 62 y.o. male who presents to the clinic today for:   HPI    Retina Follow Up    Patient presents with  Other.  In both eyes.  This started 2 months ago.  Severity is mild.  Since onset it is stable.  I, the attending physician,  performed the HPI with the patient and updated documentation appropriately.          Comments    F/U Retina lattice deg. OU. Patient states his is about the same, he occasionally has flashes and floaters ou.Denies new visual onsets. Denies vit's/gtt's       Last edited by Bernarda Caffey, MD on 10/15/2017  8:19 AM. (History)    Pt states   Referring physician: Gaynelle Arabian, MD [address], [place] [address]  HISTORICAL INFORMATION:   Selected notes from the MEDICAL RECORD NUMBER Referred by Dr. Read Drivers for concern of persistent photopsia;  LEE- 04.02.19 (C. Weaver) [BCVA OD: 20/20 OS: 20/20-2] Ocular Hx- cataract OU, glaucoma suspect OU, DES OU, s/p strabismus sx ([birth year].1962) PMH-     CURRENT MEDICATIONS: No current outpatient medications on file. (Ophthalmic Drugs)   No current facility-administered medications for this visit.  (Ophthalmic Drugs)   Current Outpatient Medications (Other)  Medication Sig  . ibuprofen (ADVIL,MOTRIN) 200 MG tablet Take 200 mg by mouth every 6 (six) hours as needed.   No current facility-administered medications for this visit.  (Other)      REVIEW OF SYSTEMS: ROS    Positive for: Eyes   Negative for: Constitutional, Gastrointestinal, Neurological, Skin, Genitourinary, Musculoskeletal, HENT, Endocrine, Cardiovascular, Respiratory, Psychiatric, Allergic/Imm, Heme/Lymph   Last edited by Zenovia Jordan, LPN on 3/56/8616  8:37 AM. (History)       ALLERGIES No Known Allergies  PAST MEDICAL HISTORY Past Medical  History:  Diagnosis Date  . Diverticulosis   . Numbness   . Vertigo    Past Surgical History:  Procedure Laterality Date  . arm surgery    . EYE SURGERY      FAMILY HISTORY Family History  Problem Relation Age of Onset  . Cataracts Maternal Grandfather   . Strabismus Paternal Grandmother   . Cataracts Paternal Grandfather   . Amblyopia Paternal Grandfather   . Blindness Paternal Grandfather   . Diabetes Paternal Grandfather   . Glaucoma Paternal Grandfather   . Macular degeneration Paternal Grandfather   . Retinal detachment Paternal Grandfather   . Retinitis pigmentosa Paternal Grandfather     SOCIAL HISTORY Social History   Tobacco Use  . Smoking status: Never Smoker  . Smokeless tobacco: Never Used  Substance Use Topics  . Alcohol use: Yes  . Drug use: No         OPHTHALMIC EXAM:  Base Eye Exam    Visual Acuity (Snellen - Linear)      Right Left   Dist cc 20/25 20/25   Dist ph cc NI NI   Correction:  Glasses       Tonometry (Tonopen, 8:12 AM)      Right Left   Pressure 16 16       Pupils      Dark Light Shape React APD   Right 4 3 Irregular Slow None   Left 4 3 Round Brisk None  Visual Fields (Counting fingers)      Left Right    Full Full       Extraocular Movement      Right Left    Full, Ortho Full, Ortho       Neuro/Psych    Oriented x3:  Yes   Mood/Affect:  Normal       Dilation    Both eyes:  1.0% Mydriacyl, 2.5% Phenylephrine @ 8:13 AM        Slit Lamp and Fundus Exam    Slit Lamp Exam      Right Left   Lids/Lashes Dermatochalasis - upper lid Dermatochalasis - upper lid   Conjunctiva/Sclera White and quiet White and quiet   Cornea Mild Arcus, otherwise clear Mild Arcus, otherwise clear   Anterior Chamber Deep and quiet Deep and quiet   Iris Round and dilated Round and dilated   Lens 2+ Nuclear sclerosis, 2+ Cortical cataract 2+ Nuclear sclerosis, 2+ Cortical cataract   Vitreous Vitreous syneresis, Posterior  vitreous detachment Vitreous syneresis, Posterior vitreous detachment       Fundus Exam      Right Left   Disc Pink and Sharp Pink and Sharp   C/D Ratio 0.5 0.5   Macula Flat, No heme or edema Good foveal reflex, mild Retinal pigment epithelial mottling, No heme or edema   Vessels Mild Vascular attenuation Normal   Periphery Attached, pigmented Lattice degeneration inferiorly w/ VR tuft / linear tear at 0600, small patch of pigmented lattice/peripheral cystoid degen temporally from 0730 - 1000, VR tuft at 0130 ora -- good laser changes at 130 and 0600 Attached, peripheral cystoid degeneration at 0600, pigmented Lattice degeneration inferiorly, VR tufts / linear retinal microtears at 0600, VR tuft at 0130, two VR tufts in superonasal quadrant ora -- good laser surrounding all lesions          IMAGING AND PROCEDURES  Imaging and Procedures for 07/11/17           ASSESSMENT/PLAN:    ICD-10-CM   1. 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 Retinal tear of both eyes H33.313   3. Posterior vitreous detachment of both eyes H43.813   4. Retinal edema H35.81   5. Combined forms of age-related cataract of both eyes H25.813   6. Peripheral cystic tuft Q14.1     1,2. Lattice degeneration w/ vitreoretinal tufts / linear retinal tears OU - OD: pigmented lattice at 0600, 0730, with linear retinal breaks/VR tufts at 0130 and 0600 - OS: pigmented lattice inferiorly, VR tufts/linear retinal tears at 0600; VR tuft at 0130 and two VR tufts in superonasal quadrant ora - S/P laser retinopexy OS (06.25.19) -- good laser changes in place - S/P laser retinopexy OD (07.09.19) -- good laser changes in place - finished PF OD QID x 7 days - f/u 3 months, sooner prn  3. PVD / vitreous syneresis OU  Pt reports floaters and intermittent photopsias OD  Discussed findings and prognosis  No new RT or RD on repeat 360 scleral depressed exam OU  Reviewed s/s of RT/RD  Strict return precautions  for any such RT/RD signs/symptoms  4. No retinal edema on exam or OCT  5. Combined form age-related cataract OU-  - The symptoms of cataract, surgical options, and treatments and risks were discussed with patient. - discussed diagnosis and progression - not yet visually significant - monitor for now    Ophthalmic Meds Ordered this visit:  No orders of the defined types  were placed in this encounter.      Return in about 3 months (around 01/15/2018) for F/U laser ret OU, DFE, OCT.  There are no Patient Instructions on file for this visit.   Explained the diagnoses, plan, and follow up with the patient and they expressed understanding.  Patient expressed understanding of the importance of proper follow up care.   This document serves as a record of services personally performed by Gardiner Sleeper, MD, PhD. It was created on their behalf by Catha Brow, [place], a certified ophthalmic assistant. The creation of this record is the provider's dictation and/or activities during the visit.  Electronically signed by: Catha Brow, COA  07.22.19 8:42 AM   Gardiner Sleeper, M.D., Ph.D. Diseases & Surgery of the Retina and Vitreous [facility]  I have reviewed the above documentation for accuracy and completeness, and I agree with the above. Gardiner Sleeper, M.D., Ph.D. 10/15/17 8:43 AM    Abbreviations: M myopia (nearsighted); A astigmatism; H hyperopia (farsighted); P presbyopia; Mrx spectacle prescription;  CTL contact lenses; OD right eye; OS left eye; OU both eyes  XT exotropia; ET esotropia; PEK punctate epithelial keratitis; PEE punctate epithelial erosions; DES dry eye syndrome; MGD meibomian gland dysfunction; ATs artificial tears; PFAT's preservative free artificial tears; [place] nuclear sclerotic cataract; PSC posterior subcapsular cataract; ERM epi-retinal membrane; PVD posterior vitreous detachment; RD retinal detachment; DM diabetes mellitus; DR  diabetic retinopathy; NPDR non-proliferative diabetic retinopathy; PDR proliferative diabetic retinopathy; CSME clinically significant macular edema; DME diabetic macular edema; dbh dot blot hemorrhages; CWS cotton wool spot; POAG primary open angle glaucoma; C/D cup-to-disc ratio; HVF humphrey visual field; GVF goldmann visual field; OCT optical coherence tomography; IOP intraocular pressure; BRVO Branch retinal vein occlusion; CRVO central retinal vein occlusion; CRAO central retinal artery occlusion; BRAO branch retinal artery occlusion; RT retinal tear; SB scleral buckle; PPV pars plana vitrectomy; VH Vitreous hemorrhage; PRP panretinal laser photocoagulation; IVK intravitreal kenalog; VMT vitreomacular traction; MH Macular hole;  NVD neovascularization of the disc; NVE neovascularization elsewhere; AREDS age related eye disease study; ARMD age related macular degeneration; POAG primary open angle glaucoma; EBMD epithelial/anterior basement membrane dystrophy; ACIOL anterior chamber intraocular lens; IOL intraocular lens; PCIOL posterior chamber intraocular lens; Phaco/IOL phacoemulsification with intraocular lens placement; [place] photorefractive keratectomy; LASIK laser assisted in situ keratomileusis; HTN hypertension; DM diabetes mellitus; COPD chronic obstructive pulmonary disease

## 2017-10-15 ENCOUNTER — Encounter (INDEPENDENT_AMBULATORY_CARE_PROVIDER_SITE_OTHER): Payer: Self-pay | Admitting: Ophthalmology

## 2017-10-15 ENCOUNTER — Ambulatory Visit (INDEPENDENT_AMBULATORY_CARE_PROVIDER_SITE_OTHER): Payer: BLUE CROSS/BLUE SHIELD | Admitting: Ophthalmology

## 2017-10-15 DIAGNOSIS — H33313 Horseshoe tear of retina without detachment, bilateral: Secondary | ICD-10-CM

## 2017-10-15 DIAGNOSIS — H43813 Vitreous degeneration, bilateral: Secondary | ICD-10-CM

## 2017-10-15 DIAGNOSIS — H35413 Lattice degeneration of retina, bilateral: Secondary | ICD-10-CM

## 2017-10-15 DIAGNOSIS — H25813 Combined forms of age-related cataract, bilateral: Secondary | ICD-10-CM

## 2017-10-15 DIAGNOSIS — Q141 Congenital malformation of retina: Secondary | ICD-10-CM

## 2017-10-15 DIAGNOSIS — H3581 Retinal edema: Secondary | ICD-10-CM

## 2017-10-24 DIAGNOSIS — Z23 Encounter for immunization: Secondary | ICD-10-CM | POA: Diagnosis not present

## 2017-10-24 DIAGNOSIS — S81812A Laceration without foreign body, left lower leg, initial encounter: Secondary | ICD-10-CM | POA: Diagnosis not present

## 2017-11-27 DIAGNOSIS — D2262 Melanocytic nevi of left upper limb, including shoulder: Secondary | ICD-10-CM | POA: Diagnosis not present

## 2017-11-27 DIAGNOSIS — L570 Actinic keratosis: Secondary | ICD-10-CM | POA: Diagnosis not present

## 2017-11-27 DIAGNOSIS — D225 Melanocytic nevi of trunk: Secondary | ICD-10-CM | POA: Diagnosis not present

## 2017-11-27 DIAGNOSIS — L821 Other seborrheic keratosis: Secondary | ICD-10-CM | POA: Diagnosis not present

## 2017-11-27 DIAGNOSIS — L814 Other melanin hyperpigmentation: Secondary | ICD-10-CM | POA: Diagnosis not present

## 2018-01-14 NOTE — Progress Notes (Signed)
[facility] Note  01/15/2018     CHIEF COMPLAINT Patient presents for Post-op Follow-up   HISTORY OF PRESENT ILLNESS: Robert Nelson is a 62 y.o. male who presents to the clinic today for:   HPI    Post-op Follow-up    In both eyes.  Vision is stable.  I, the attending physician,  performed the HPI with the patient and updated documentation appropriately.          Comments    62 y/o male pt returning for 3 mo f/u for laser retinopexy OU.  S/p laser ret OS on 06.25.19 and s/p laser ret OD on 07.09.19.  VA good OU cc.  Denies pain, flashes, floaters.  No gtts.       Last edited by Bernarda Caffey, MD on 01/16/2018 10:14 PM. (History)    Pt states he is doing well and that vision is good OU.  Referring physician: Gaynelle Arabian, MD [address], [place] [address]  HISTORICAL INFORMATION:   Selected notes from the MEDICAL RECORD NUMBER Referred by Dr. Read Drivers for concern of persistent photopsia;  LEE- 04.02.19 (C. Weaver) [BCVA OD: 20/20 OS: 20/20-2] Ocular Hx- cataract OU, glaucoma suspect OU, DES OU, s/p strabismus sx (01.01.1962) PMH-     CURRENT MEDICATIONS: No current outpatient medications on file. (Ophthalmic Drugs)   No current facility-administered medications for this visit.  (Ophthalmic Drugs)   Current Outpatient Medications (Other)  Medication Sig  . ibuprofen (ADVIL,MOTRIN) 200 MG tablet Take 200 mg by mouth every 6 (six) hours as needed.   No current facility-administered medications for this visit.  (Other)      REVIEW OF SYSTEMS: ROS    Positive for: Eyes   Negative for: Constitutional, Gastrointestinal, Neurological, Skin, Genitourinary, Musculoskeletal, HENT, Endocrine, Cardiovascular, Respiratory, Psychiatric, Allergic/Imm, Heme/Lymph   Last edited by Matthew Folks, COA on 01/15/2018  2:35 PM. (History)       ALLERGIES No Known Allergies  PAST MEDICAL HISTORY Past Medical History:   Diagnosis Date  . Diverticulosis   . Numbness   . Vertigo    Past Surgical History:  Procedure Laterality Date  . arm surgery    . EYE SURGERY      FAMILY HISTORY Family History  Problem Relation Age of Onset  . Cataracts Maternal Grandfather   . Strabismus Paternal Grandmother   . Cataracts Paternal Grandfather   . Amblyopia Paternal Grandfather   . Blindness Paternal Grandfather   . Diabetes Paternal Grandfather   . Glaucoma Paternal Grandfather   . Macular degeneration Paternal Grandfather   . Retinal detachment Paternal Grandfather   . Retinitis pigmentosa Paternal Grandfather     SOCIAL HISTORY Social History   Tobacco Use  . Smoking status: Never Smoker  . Smokeless tobacco: Never Used  Substance Use Topics  . Alcohol use: Yes  . Drug use: No         OPHTHALMIC EXAM:  Base Eye Exam    Visual Acuity (Snellen - Linear)      Right Left   Dist cc 20/20 20/25 -2   Dist ph cc  NI   Correction:  Glasses       Tonometry (Tonopen, 2:43 PM)      Right Left   Pressure 12 14       Pupils      Dark Light Shape React APD   Right 4 3 Irregular Slow None   Left 4  3 Round Brisk Trace       Visual Fields (Counting fingers)      Left Right    Full Full       Extraocular Movement      Right Left    Full, Ortho Full, Ortho       Neuro/Psych    Oriented x3:  Yes   Mood/Affect:  Normal       Dilation    Both eyes:  1.0% Mydriacyl, 2.5% Phenylephrine @ 2:43 PM        Slit Lamp and Fundus Exam    Slit Lamp Exam      Right Left   Lids/Lashes Dermatochalasis - upper lid Dermatochalasis - upper lid   Conjunctiva/Sclera White and quiet White and quiet   Cornea 1+ PEE.  Mild endopigment. 1+ PEE.  Trace endopigment.   Anterior Chamber Deep and quiet Deep and quiet   Iris Round and dilated Round and dilated   Lens 2+ Nuclear sclerosis, 2+ Cortical cataract 2+ Nuclear sclerosis, 2+ Cortical cataract   Vitreous Vitreous syneresis, Posterior vitreous  detachment Vitreous syneresis, Posterior vitreous detachment       Fundus Exam      Right Left   Disc Pink and Sharp Pink and Sharp   C/D Ratio 0.5 0.5   Macula Flat, No heme or edema Good foveal reflex, mild Retinal pigment epithelial mottling, No heme or edema   Vessels Mild Vascular attenuation Normal   Periphery Attached, pigmented Lattice degeneration inferiorly w/ VR tuft / linear tear at 0600, small patch of pigmented lattice/peripheral cystoid degen temporally from 0730 - 1000, VR tuft at 0130 ora -- good laser changes at 130 and 0600 Attached, peripheral cystoid degeneration at 0600, pigmented Lattice degeneration inferiorly, VR tufts / linear retinal microtears at 0600, VR tuft at 0130, two VR tufts in superonasal quadrant ora -- good laser surrounding all lesions.  No new RT/RD.          IMAGING AND PROCEDURES  Imaging and Procedures for 07/11/17  OCT, Retina - OU - Both Eyes       Right Eye Quality was good. Central Foveal Thickness: 281. Progression has been stable. Findings include normal foveal contour, no IRF, no SRF.   Left Eye Quality was good. Central Foveal Thickness: 285. Progression has been stable. Findings include normal foveal contour, no IRF, no SRF, vitreomacular adhesion .   Notes *Images captured and stored on drive  Diagnosis / Impression:  NFP; no IRF/SRF OU  Clinical management:  See below  Abbreviations: NFP - Normal foveal profile. CME - cystoid macular edema. PED - pigment epithelial detachment. IRF - intraretinal fluid. SRF - subretinal fluid. EZ - ellipsoid zone. ERM - epiretinal membrane. ORA - outer retinal atrophy. ORT - outer retinal tubulation. SRHM - subretinal hyper-reflective material                 ASSESSMENT/PLAN:    ICD-10-CM   1. 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 Retinal tear of both eyes H33.313   3. Posterior vitreous detachment of both eyes H43.813   4. Retinal edema H35.81 OCT, Retina - OU -  Both Eyes  5. Combined forms of age-related cataract of both eyes H25.813   6. Peripheral cystic tuft Q14.1     1,2. Lattice degeneration w/ vitreoretinal tufts / linear retinal tears OU - OD: pigmented lattice at 0600, 0730, with linear retinal breaks/VR tufts at 0130 and 0600 - OS: pigmented lattice inferiorly, VR  tufts/linear retinal tears at 0600; VR tuft at 0130 and two VR tufts in superonasal quadrant ora - S/P laser retinopexy OS (06.25.19) -- good laser changes in place - S/P laser retinopexy OD (07.09.19) -- good laser changes in place - f/u 6-9 months, sooner prn  3. PVD / vitreous syneresis OU  Pt reports floaters and intermittent photopsias OD  Discussed findings and prognosis  No new RT or RD on repeat 360 scleral depressed exam OU  Reviewed s/s of RT/RD  Strict return precautions for any such RT/RD signs/symptoms  4. No retinal edema on exam or OCT  5. Combined form age-related cataract OU-  - The symptoms of cataract, surgical options, and treatments and risks were discussed with patient. - discussed diagnosis and progression - not yet visually significant - monitor for now    Ophthalmic Meds Ordered this visit:  No orders of the defined types were placed in this encounter.      Return for f/u 6-9 mos for lattice ou.  There are no Patient Instructions on file for this visit.   Explained the diagnoses, plan, and follow up with the patient and they expressed understanding.  Patient expressed understanding of the importance of proper follow up care.   This document serves as a record of services personally performed by Gardiner Sleeper, MD, PhD. It was created on their behalf by Ernest Mallick, OA, an ophthalmic assistant. The creation of this record is the provider's dictation and/or activities during the visit.    Electronically signed by: Ernest Mallick, OA  10.22.19 10:14 PM    Gardiner Sleeper, M.D., Ph.D. Diseases & Surgery of the Retina and Vitreous [facility]  [place]   I have reviewed the above documentation for accuracy and completeness, and I agree with the above. Gardiner Sleeper, M.D., Ph.D. 01/16/18 10:15 PM    Abbreviations: M myopia (nearsighted); A astigmatism; H hyperopia (farsighted); P presbyopia; Mrx spectacle prescription;  CTL contact lenses; OD right eye; OS left eye; OU both eyes  XT exotropia; ET esotropia; PEK punctate epithelial keratitis; PEE punctate epithelial erosions; DES dry eye syndrome; MGD meibomian gland dysfunction; ATs artificial tears; PFAT's preservative free artificial tears; [place] nuclear sclerotic cataract; PSC posterior subcapsular cataract; ERM epi-retinal membrane; PVD posterior vitreous detachment; RD retinal detachment; DM diabetes mellitus; DR diabetic retinopathy; NPDR non-proliferative diabetic retinopathy; PDR proliferative diabetic retinopathy; CSME clinically significant macular edema; DME diabetic macular edema; dbh dot blot hemorrhages; CWS cotton wool spot; POAG primary open angle glaucoma; C/D cup-to-disc ratio; HVF humphrey visual field; GVF goldmann visual field; OCT optical coherence tomography; IOP intraocular pressure; BRVO Branch retinal vein occlusion; CRVO central retinal vein occlusion; CRAO central retinal artery occlusion; BRAO branch retinal artery occlusion; RT retinal tear; SB scleral buckle; PPV pars plana vitrectomy; VH Vitreous hemorrhage; PRP panretinal laser photocoagulation; IVK intravitreal kenalog; VMT vitreomacular traction; MH Macular hole;  NVD neovascularization of the disc; NVE neovascularization elsewhere; AREDS age related eye disease study; ARMD age related macular degeneration; POAG primary open angle glaucoma; EBMD epithelial/anterior basement membrane dystrophy; ACIOL anterior chamber intraocular lens; IOL intraocular lens; PCIOL posterior chamber intraocular lens; Phaco/IOL phacoemulsification with intraocular lens placement; [place] photorefractive keratectomy;  LASIK laser assisted in situ keratomileusis; HTN hypertension; DM diabetes mellitus; COPD chronic obstructive pulmonary disease

## 2018-01-15 ENCOUNTER — Ambulatory Visit (INDEPENDENT_AMBULATORY_CARE_PROVIDER_SITE_OTHER): Payer: BLUE CROSS/BLUE SHIELD | Admitting: Ophthalmology

## 2018-01-15 ENCOUNTER — Encounter (INDEPENDENT_AMBULATORY_CARE_PROVIDER_SITE_OTHER): Payer: Self-pay | Admitting: Ophthalmology

## 2018-01-15 DIAGNOSIS — Q141 Congenital malformation of retina: Secondary | ICD-10-CM

## 2018-01-15 DIAGNOSIS — H33313 Horseshoe tear of retina without detachment, bilateral: Secondary | ICD-10-CM

## 2018-01-15 DIAGNOSIS — H43813 Vitreous degeneration, bilateral: Secondary | ICD-10-CM

## 2018-01-15 DIAGNOSIS — H3581 Retinal edema: Secondary | ICD-10-CM

## 2018-01-15 DIAGNOSIS — H25813 Combined forms of age-related cataract, bilateral: Secondary | ICD-10-CM

## 2018-01-15 DIAGNOSIS — H35413 Lattice degeneration of retina, bilateral: Secondary | ICD-10-CM | POA: Diagnosis not present

## 2018-01-16 ENCOUNTER — Encounter (INDEPENDENT_AMBULATORY_CARE_PROVIDER_SITE_OTHER): Payer: Self-pay | Admitting: Ophthalmology

## 2018-02-26 DIAGNOSIS — R079 Chest pain, unspecified: Secondary | ICD-10-CM | POA: Diagnosis not present

## 2018-02-26 DIAGNOSIS — Z13220 Encounter for screening for lipoid disorders: Secondary | ICD-10-CM | POA: Diagnosis not present

## 2018-02-26 DIAGNOSIS — R1032 Left lower quadrant pain: Secondary | ICD-10-CM | POA: Diagnosis not present

## 2018-02-26 DIAGNOSIS — Z0000 Encounter for general adult medical examination without abnormal findings: Secondary | ICD-10-CM | POA: Diagnosis not present

## 2018-02-26 DIAGNOSIS — K5792 Diverticulitis of intestine, part unspecified, without perforation or abscess without bleeding: Secondary | ICD-10-CM | POA: Diagnosis not present

## 2018-07-08 IMAGING — MR MR CERVICAL SPINE WO/W CM
16 of 19 series · 32 of 48 positions shown · IV contrast (multihance)
Comparison: Neck CT 11/22/2014

CLINICAL DATA: Migratory paresthesias of the arms and legs.

EXAM:
MRI HEAD WITHOUT AND WITH CONTRAST
MRI CERVICAL SPINE WITHOUT AND WITH CONTRAST
TECHNIQUE: Multiplanar, multiecho pulse sequences of the brain and surrounding
structures, and cervical spine, to include the craniocervical
junction and cervicothoracic junction, were obtained without and
with intravenous contrast.
CONTRAST:  15mL MULTIHANCE GADOBENATE DIMEGLUMINE 529 MG/ML IV SOLN

[Series 2: T1 · sagittal · 5.0mm · 0.45mm/px · 1 of 21 slices shown (1 of 3)]
[im 1/21]
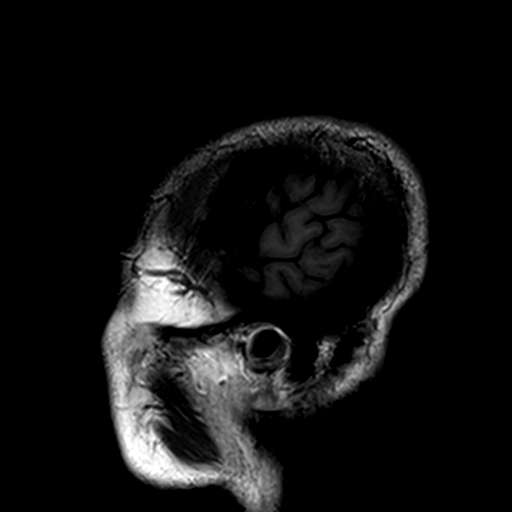

[Series 3: DWI · axial · 3.0mm · 1.80mm/px · z∈[-63,+84]mm · 5 of 100 slices shown (1 of 2)]
[im 1/100]
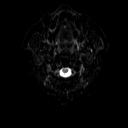
[im 25/100]
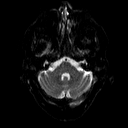
[im 50/100]
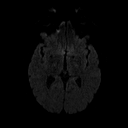
[im 75/100]
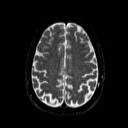
[im 100/100]
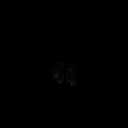

[Series 4: DWI · axial · 3.0mm · 1.80mm/px · z∈[-63,+84]mm · 2 of 45 slices shown (2 of 2)]
[im 1/45]
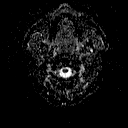
[im 45/45]
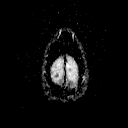

[Series 5: T2 · axial · 5.0mm · 0.51mm/px · 1 of 22 slices shown (1 of 5)]
[im 1/22]
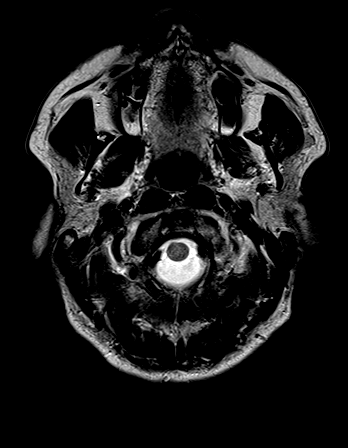

[Series 6: FLAIR · axial · 3.0mm · 0.45mm/px · z∈[-63,+84]mm · 3 of 50 slices shown]
[im 1/50]
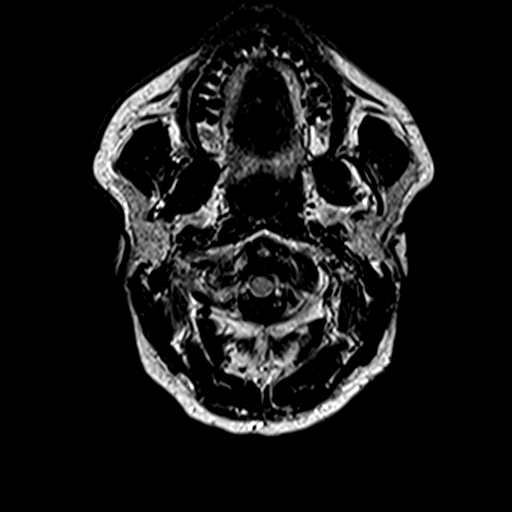
[im 25/50]
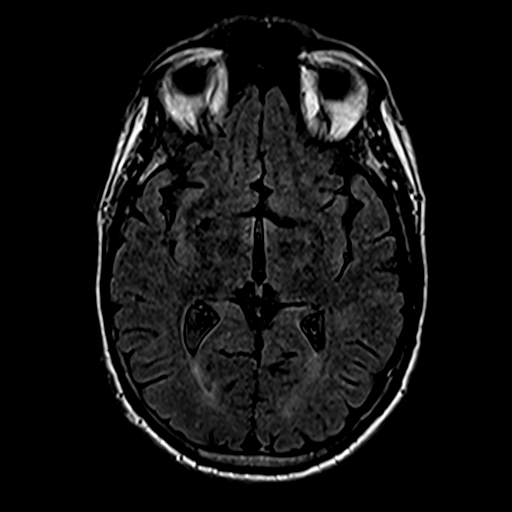
[im 50/50]
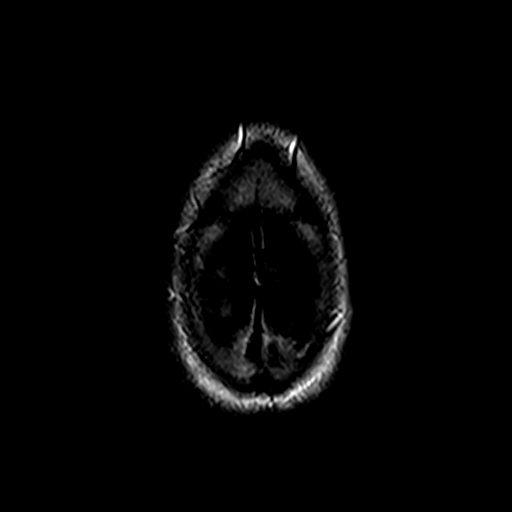

[Series 8: swi_images · axial · 2.0mm · 0.90mm/px · z∈[-68,+89]mm · 5 of 80 slices shown]
[im 1/80]
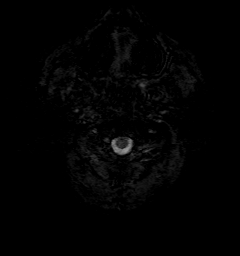
[im 20/80]
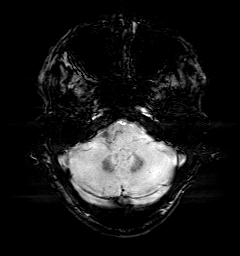
[im 40/80]
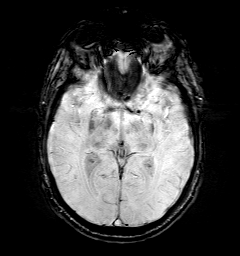
[im 60/80]
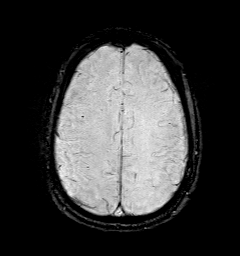
[im 80/80]
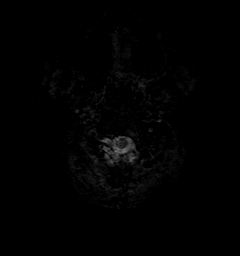

[Series 9: t1_mpr_tra · axial · 1.0mm · 0.75mm/px · z∈[-56,-36]mm · 2 of 144 slices shown]
[im 1/144]
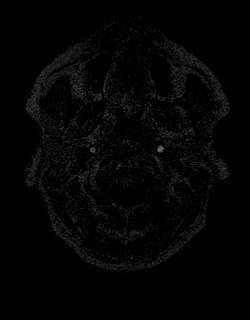
[im 21/144]
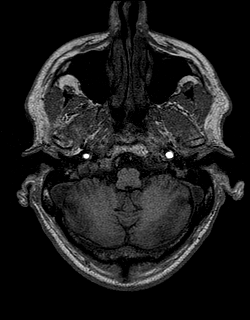

[Series 11: T2 · coronal · 5.0mm · 0.45mm/px · 1 of 25 slices shown (2 of 5)]
[im 1/25]
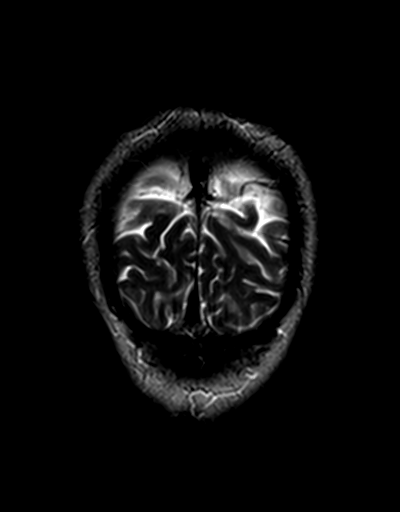

[Series 13: STIR · sagittal · 3.0mm · 0.82mm/px · 1 of 12 slices shown]
[im 1/12]
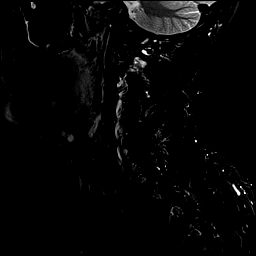

[Series 14: T1 · sagittal · 3.0mm · 0.41mm/px · 1 of 12 slices shown (2 of 3)]
[im 1/12]
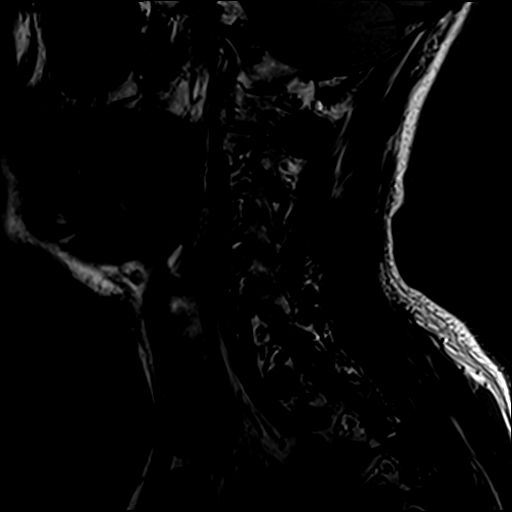

[Series 15: T2 · axial · 3.0mm · 0.39mm/px · z∈[-204,-116]mm · 2 of 26 slices shown (3 of 5)]
[im 1/26]
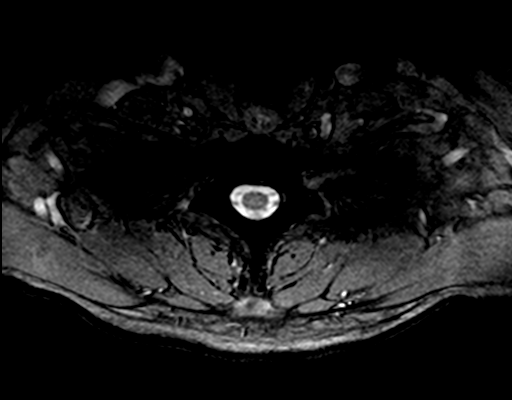
[im 26/26]
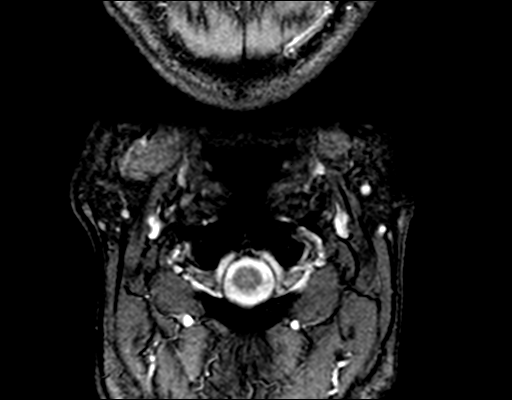

[Series 16: T2 · axial · 3.0mm · 0.39mm/px · z∈[-204,-117]mm · 2 of 26 slices shown (4 of 5)]
[im 1/26]
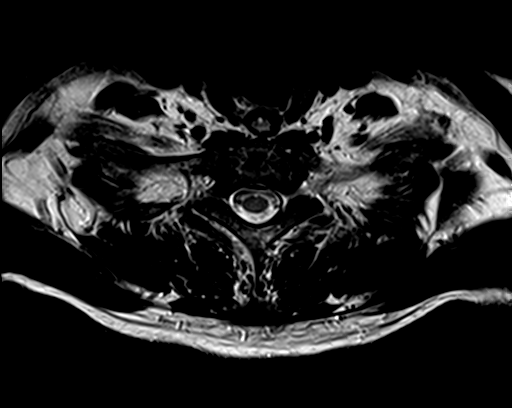
[im 26/26]
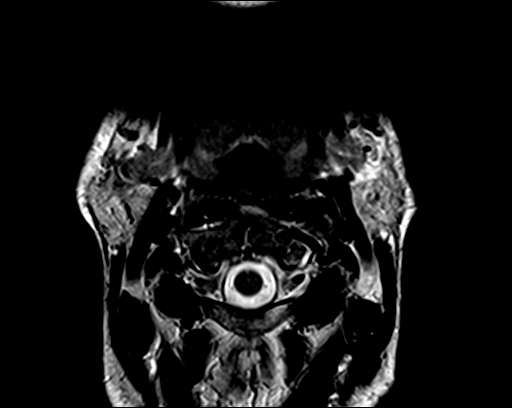

[Series 17: T1 · axial · 3.0mm · 0.35mm/px · z∈[-209,-122]mm · 2 of 26 slices shown (3 of 3)]
[im 1/26]
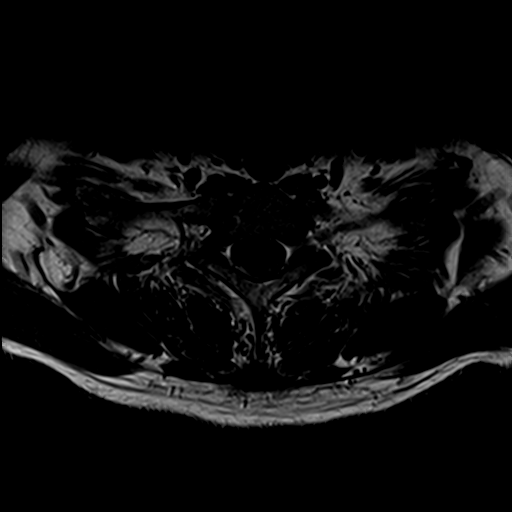
[im 26/26]
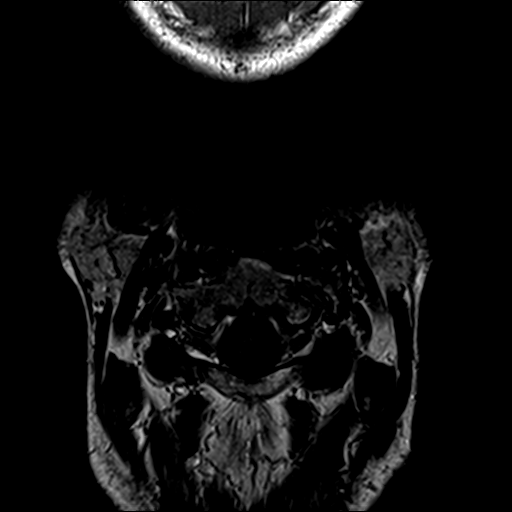

[Series 18: T2 · sagittal · 3.0mm · 0.41mm/px · 1 of 12 slices shown (5 of 5)]
[im 1/12]
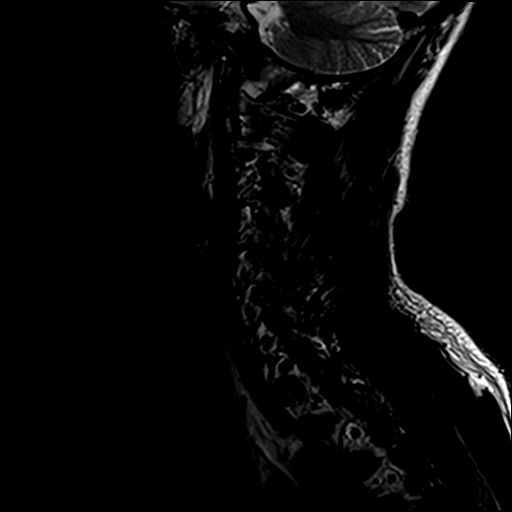

[Series 19: T1 fat-sat post-contrast · sagittal · 3.0mm · 0.82mm/px · 1 of 12 slices shown]
[im 1/12]
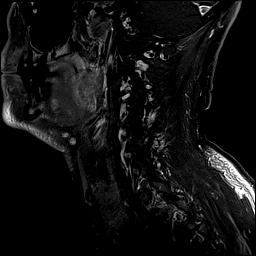

[Series 20: T1 post-contrast · axial · 3.0mm · 0.35mm/px · z∈[-209,-122]mm · 2 of 26 slices shown]
[im 1/26]
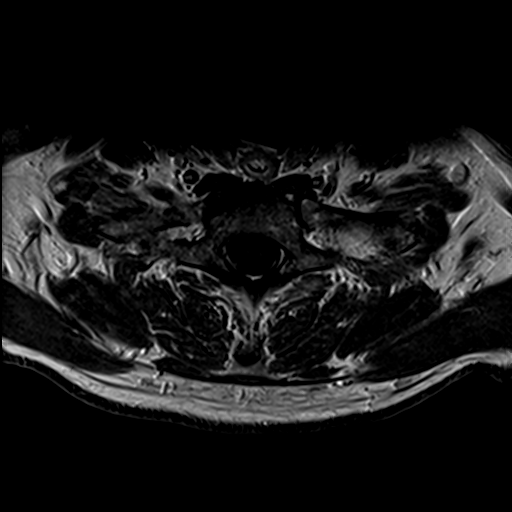
[im 26/26]
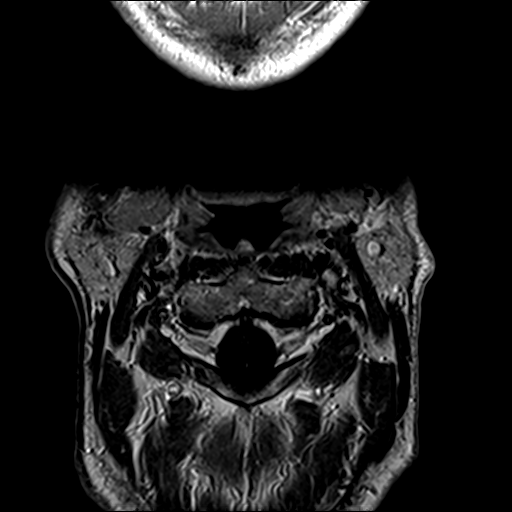

[32 of 48 positions shown; findings below may reference images not displayed]

FINDINGS: MRI HEAD FINDINGS

Brain: No focal diffusion restriction to indicate acute infarct. No
intraparenchymal hemorrhage. There is multifocal hyperintense T2
weighted signal within the subcortical white matter. The T2/FLAIR
sequence shows multiple areas of high signal in the posterior fossa,
but these are not confirmed on the traditional T2 weighted sequence
and are therefore probably artifactual. There are no pericallosal
white matter lesions. There is no parenchymal contrast enhancement.
No mass lesion or midline shift. No hydrocephalus or extra-axial
fluid collection. The midline structures are normal. No age advanced
or lobar predominant atrophy.

Vascular: Major intracranial arterial and venous sinus flow voids
are preserved. No evidence of chronic microhemorrhage or amyloid
angiopathy.

Skull and upper cervical spine: The visualized skull base,
calvarium, upper cervical spine and extracranial soft tissues are
normal.

Sinuses/Orbits: No fluid levels or advanced mucosal thickening. No
mastoid effusion. Normal orbits.

MRI CERVICAL SPINE FINDINGS

Alignment: Normal

Vertebrae: No acute compression fracture, discitis-osteomyelitis,
facet edema or other focal marrow lesion. No epidural collection.

Cord: Normal caliber and signal.  No contrast-enhancing lesions.

Posterior Fossa, vertebral arteries, paraspinal tissues: Visualized
posterior fossa is normal. Vertebral artery flow voids are
preserved. Normal visualized paraspinal soft tissues.

Disc levels:

C1-C2: Normal.

C2-C3: Normal disc space and facets. No spinal canal or
neuroforaminal stenosis.

C3-C4: Normal disc space and facets. No spinal canal or
neuroforaminal stenosis.

C4-C5: Normal disc space and facets. No spinal canal or
neuroforaminal stenosis.

C5-C6: There is bilateral uncovertebral hypertrophy and small disc
bulge. No spinal canal stenosis. There is moderate bilateral
foraminal narrowing.

C6-C7: Small disc osteophyte complex with mild right foraminal
narrowing. No spinal canal stenosis.

C7-T1: Normal disc space and facets. No spinal canal or
neuroforaminal stenosis.
IMPRESSION: 1. Multifocal white matter hyperintensities in a nonspecific
distribution, but most commonly seen in this age population as the
sequelae of chronic microvascular ischemia. The pattern is not
suggestive of demyelinating disease.
2. Moderate bilateral C6 neural foraminal stenosis and mild right C7
foraminal narrowing.
3. No evidence of spinal cord demyelination.

## 2018-12-03 DIAGNOSIS — L821 Other seborrheic keratosis: Secondary | ICD-10-CM | POA: Diagnosis not present

## 2018-12-03 DIAGNOSIS — D225 Melanocytic nevi of trunk: Secondary | ICD-10-CM | POA: Diagnosis not present

## 2018-12-03 DIAGNOSIS — D2271 Melanocytic nevi of right lower limb, including hip: Secondary | ICD-10-CM | POA: Diagnosis not present

## 2018-12-03 DIAGNOSIS — D2261 Melanocytic nevi of right upper limb, including shoulder: Secondary | ICD-10-CM | POA: Diagnosis not present

## 2019-01-12 DIAGNOSIS — H00025 Hordeolum internum left lower eyelid: Secondary | ICD-10-CM | POA: Diagnosis not present

## 2019-01-12 DIAGNOSIS — H2513 Age-related nuclear cataract, bilateral: Secondary | ICD-10-CM | POA: Diagnosis not present

## 2019-03-04 DIAGNOSIS — Z0000 Encounter for general adult medical examination without abnormal findings: Secondary | ICD-10-CM | POA: Diagnosis not present

## 2019-03-05 DIAGNOSIS — Z13220 Encounter for screening for lipoid disorders: Secondary | ICD-10-CM | POA: Diagnosis not present

## 2019-03-05 DIAGNOSIS — Z0000 Encounter for general adult medical examination without abnormal findings: Secondary | ICD-10-CM | POA: Diagnosis not present

## 2019-03-05 DIAGNOSIS — Z125 Encounter for screening for malignant neoplasm of prostate: Secondary | ICD-10-CM | POA: Diagnosis not present

## 2019-03-05 DIAGNOSIS — Z23 Encounter for immunization: Secondary | ICD-10-CM | POA: Diagnosis not present

## 2019-07-13 DIAGNOSIS — H5319 Other subjective visual disturbances: Secondary | ICD-10-CM | POA: Diagnosis not present

## 2019-07-13 DIAGNOSIS — H2513 Age-related nuclear cataract, bilateral: Secondary | ICD-10-CM | POA: Diagnosis not present

## 2019-07-13 DIAGNOSIS — H25013 Cortical age-related cataract, bilateral: Secondary | ICD-10-CM | POA: Diagnosis not present

## 2019-07-13 DIAGNOSIS — H40013 Open angle with borderline findings, low risk, bilateral: Secondary | ICD-10-CM | POA: Diagnosis not present

## 2019-07-16 ENCOUNTER — Encounter: Payer: Self-pay | Admitting: Neurology

## 2019-07-16 ENCOUNTER — Telehealth: Payer: BLUE CROSS/BLUE SHIELD | Admitting: Physician Assistant

## 2019-07-16 NOTE — Progress Notes (Signed)
Hi Robert Nelson,  I am sorry you are not felling well.  You have contacted [facility] E-visit provider pool - we are not associated with any medical office and cannot schedule appointments.  You will need to reach out to their office to schedule the appointment.     NOTE: If you entered your credit card information for this eVisit, you will not be charged. You may see a "hold" on your card for the $35 but that hold will drop off and you will not have a charge processed.    If you are having a true medical emergency please call 911.      For an urgent face to face visit, [facility] has five urgent care centers for your convenience:      NEW:  [facility] [place] [facility] [place] Get Driving Directions [id number] [address] [place], [place] [address] . 10 am - 6pm Monday - Friday    [facility]) Get Driving Directions [id number] [address] [place], [place] [address] . 10 am to 8 pm Monday-Friday . 12 pm to 8 pm [facility] at [facility] Get Driving Directions [id number] [place], [place], [place] [address] . 8 am to 8 pm Monday-Friday . 9 am to 6 pm Saturday . 11 am to 6 pm Sunday     [facility] at [facility] Get Driving Directions  [id number] [address] [place] [place] [address] . 8 am to 8 pm Monday-Friday . 8 am to 4 pm [facility] at [place] Get Driving Directions [id number] [place], [place] [address] . 12 pm to 6 pm Monday-Friday      Your e-visit answers were reviewed by a board certified advanced clinical practitioner to complete your personal care plan.  Thank you for using e-Visits.

## 2019-07-17 DIAGNOSIS — R42 Dizziness and giddiness: Secondary | ICD-10-CM | POA: Diagnosis not present

## 2019-11-06 ENCOUNTER — Other Ambulatory Visit: Payer: Self-pay

## 2019-11-06 ENCOUNTER — Encounter: Payer: Self-pay | Admitting: Neurology

## 2019-11-06 ENCOUNTER — Ambulatory Visit (INDEPENDENT_AMBULATORY_CARE_PROVIDER_SITE_OTHER): Payer: BC Managed Care – PPO | Admitting: Neurology

## 2019-11-06 VITALS — BP 109/70 | HR 74 | Resp 18 | Ht 69.0 in | Wt 158.0 lb

## 2019-11-06 DIAGNOSIS — R42 Dizziness and giddiness: Secondary | ICD-10-CM | POA: Diagnosis not present

## 2019-11-06 MED ORDER — MECLIZINE HCL 25 MG PO CHEW: 25.0000 mg | CHEWABLE_TABLET | Freq: Every day | ORAL | 3 refills | Status: DC | PRN

## 2019-11-06 NOTE — Patient Instructions (Addendum)
Start vestibular therapy For severe spells, you can try meclinzine 25mg   Please send me a MyChart message in 2-3 months with an update

## 2019-11-06 NOTE — Progress Notes (Signed)
[facility] Clinic Note - Initial Visit   Date: 11/06/19  Robert Nelson MRN: [id number] DOB: 1956/03/12   Dear Dr. Marisue Humble:  Thank you for your kind referral of Robert Nelson for consultation of dizziness. Although his history is well known to you, please allow [name] to reiterate it for the purpose of our medical record. The patient was accompanied to the clinic by self.    History of Present Illness: Robert Nelson is a 64 y.o. right-handed male presenting for evaluation of dizziness.  For the past 10 years, he reports having some degree of underlying sensation that he is "on a boat".  He gets spinning sensation if he moves his body too quickly, but also has sensation of swaying with eye movement. If he refocuses or stay still, symptoms will quickly subside within seconds.  It can occur at rest such as when sitting, sleeping, or standing, but tends to be worse with movement. He has not been evaluated by ENT for vertigo, tried medication, or done vestibular therapy.  Symptoms occur daily and he feels as if he is on a boat.     Out-side paper records, electronic medical record, and images have been reviewed where available and summarized as:  MRI brain and cervical spine wwo contrast 06/12/2016: 1. Multifocal white matter hyperintensities in a nonspecific distribution, but most commonly seen in this age population as the sequelae of chronic microvascular ischemia. The pattern is not suggestive of demyelinating disease. 2. Moderate bilateral C6 neural foraminal stenosis and mild right C7 foraminal narrowing. 3. No evidence of spinal cord demyelination. No results found for: HGBA1C Lab Results  Component Value Date   VITAMINB12 294 08/15/2016   Lab Results  Component Value Date   TSH 2.32 02/03/2010   Lab Results  Component Value Date   ESRSEDRATE 16 08/15/2016    Past Medical History:  Diagnosis Date  . Diverticulosis   . Numbness   . Vertigo      Past Surgical History:  Procedure Laterality Date  . arm surgery    . EYE SURGERY       Medications:  Outpatient Encounter Medications as of 11/06/2019  Medication Sig  . ibuprofen (ADVIL,MOTRIN) 200 MG tablet Take 200 mg by mouth every 6 (six) hours as needed.  . Meclizine HCl 25 MG CHEW Chew 1 tablet (25 mg total) by mouth daily as needed.   No facility-administered encounter medications on file as of 11/06/2019.    Allergies: No Known Allergies  Family History: Family History  Problem Relation Age of Onset  . Cataracts Maternal Grandfather   . Strabismus Paternal Grandmother   . Cataracts Paternal Grandfather   . Amblyopia Paternal Grandfather   . Blindness Paternal Grandfather   . Diabetes Paternal Grandfather   . Glaucoma Paternal Grandfather   . Macular degeneration Paternal Grandfather   . Retinal detachment Paternal Grandfather   . Retinitis pigmentosa Paternal Grandfather     Social History: Social History   Tobacco Use  . Smoking status: Never Smoker  . Smokeless tobacco: Never Used  Vaping Use  . Vaping Use: Never used  Substance Use Topics  . Alcohol use: Yes  . Drug use: No   Social History   Social History Narrative   Lives with wife in a 2 story home with basement.  Has 2 children.  Works as an Agricultural consultant.  Education: college.      Right handed    Vital Signs:  BP 109/70  Pulse 74   Resp 18   Ht 5\' 9"  (1.753 m)   Wt 158 lb (71.7 kg)   SpO2 98%   BMI 23.33 kg/m   General Medical Exam:   General:  Well appearing, comfortable  Eyes/ENT: see cranial nerve examination.   Neck:   No carotid bruits. Respiratory:  Clear to auscultation, good air entry bilaterally.   Cardiac:  Regular rate and rhythm, no murmur.   Ext:  No edema  Neurological Exam: MENTAL STATUS including orientation to time, place, person, recent and remote memory, attention span and concentration, language, and fund of knowledge is normal.  Speech is not  dysarthric.  CRANIAL NERVES: II:  No visual field defects. III-IV-VI: Pupils equal round and reactive to light.  Normal conjugate, extra-ocular eye movements in all directions of gaze.  Mild end-point nystagmus bilaterally.  No ptosis.   V:  Normal facial sensation.    VII:  Normal facial symmetry and movements.   VIII:  Normal hearing.  Dix Hallpike positive.   IX-X:  Normal palatal movement.   XI:  Normal shoulder shrug and head rotation.   XII:  Normal tongue strength and range of motion, no deviation or fasciculation.  MOTOR:  No atrophy, fasciculations or abnormal movements.  No pronator drift.   Upper Extremity:  Right  Left  Deltoid  5/5   5/5   Biceps  5/5   5/5   Triceps  5/5   5/5   Infraspinatus 5/5  5/5  Medial pectoralis 5/5  5/5  Wrist extensors  5/5   5/5   Wrist flexors  5/5   5/5   Finger extensors  5/5   5/5   Finger flexors  5/5   5/5   Dorsal interossei  5/5   5/5   Abductor pollicis  5/5   5/5   Tone (Ashworth scale)  0  0   Lower Extremity:  Right  Left  Hip flexors  5/5   5/5   Hip extensors  5/5   5/5   Adductor 5/5  5/5  Abductor 5/5  5/5  Knee flexors  5/5   5/5   Knee extensors  5/5   5/5   Dorsiflexors  5/5   5/5   Plantarflexors  5/5   5/5   Toe extensors  5/5   5/5   Toe flexors  5/5   5/5   Tone (Ashworth scale)  0  0   MSRs:  Right        Left                  brachioradialis 2+  2+  biceps 2+  2+  triceps 2+  2+  patellar 2+  2+  ankle jerk 2+  2+  Hoffman no  no  plantar response down  down   SENSORY:  Normal and symmetric perception of light touch, pinprick, vibration, and proprioception.  Romberg's sign absent.   COORDINATION/GAIT: Normal finger-to- nose-finger and heel-to-shin.  Intact rapid alternating movements bilaterally. Gait narrow based and stable. Tandem and stressed gait intact.    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 BPPV seems less likely given the chronicity and  constant nature of symptoms.   I will offer a trial of meclizine and vestibular therapy and see how he responds.   If no improvement, consider ENT evaluation and/or trial of clonazepam Patient to send MyChart update in 2-3 months   Thank you for allowing me  to participate in patient's care.  If I can answer any additional questions, I would be pleased to do so.    Sincerely,    Travez Stancil K. Posey Pronto, DO

## 2019-11-16 DIAGNOSIS — R42 Dizziness and giddiness: Secondary | ICD-10-CM | POA: Diagnosis not present

## 2019-11-16 DIAGNOSIS — R2689 Other abnormalities of gait and mobility: Secondary | ICD-10-CM | POA: Diagnosis not present

## 2019-11-23 DIAGNOSIS — R2689 Other abnormalities of gait and mobility: Secondary | ICD-10-CM | POA: Diagnosis not present

## 2019-11-23 DIAGNOSIS — R42 Dizziness and giddiness: Secondary | ICD-10-CM | POA: Diagnosis not present

## 2019-12-01 DIAGNOSIS — R42 Dizziness and giddiness: Secondary | ICD-10-CM | POA: Diagnosis not present

## 2019-12-01 DIAGNOSIS — R2689 Other abnormalities of gait and mobility: Secondary | ICD-10-CM | POA: Diagnosis not present

## 2019-12-07 DIAGNOSIS — L821 Other seborrheic keratosis: Secondary | ICD-10-CM | POA: Diagnosis not present

## 2019-12-07 DIAGNOSIS — D2262 Melanocytic nevi of left upper limb, including shoulder: Secondary | ICD-10-CM | POA: Diagnosis not present

## 2019-12-07 DIAGNOSIS — D2261 Melanocytic nevi of right upper limb, including shoulder: Secondary | ICD-10-CM | POA: Diagnosis not present

## 2019-12-07 DIAGNOSIS — L814 Other melanin hyperpigmentation: Secondary | ICD-10-CM | POA: Diagnosis not present

## 2019-12-22 DIAGNOSIS — R2689 Other abnormalities of gait and mobility: Secondary | ICD-10-CM | POA: Diagnosis not present

## 2019-12-22 DIAGNOSIS — R42 Dizziness and giddiness: Secondary | ICD-10-CM | POA: Diagnosis not present

## 2020-10-06 ENCOUNTER — Encounter: Payer: Self-pay | Admitting: Gastroenterology

## 2021-04-19 ENCOUNTER — Encounter: Payer: Self-pay | Admitting: Gastroenterology

## 2021-05-23 ENCOUNTER — Encounter: Payer: Self-pay | Admitting: Gastroenterology

## 2021-05-23 ENCOUNTER — Ambulatory Visit (INDEPENDENT_AMBULATORY_CARE_PROVIDER_SITE_OTHER): Payer: Medicare Other | Admitting: Gastroenterology

## 2021-05-23 VITALS — BP 90/66 | HR 62 | Ht 70.0 in | Wt 165.6 lb

## 2021-05-23 DIAGNOSIS — K921 Melena: Secondary | ICD-10-CM | POA: Diagnosis not present

## 2021-05-23 MED ORDER — PLENVU 140 G PO SOLR: 1.0000 | Freq: Once | ORAL | 0 refills | Status: AC

## 2021-05-23 NOTE — Patient Instructions (Signed)
You have been scheduled for a colonoscopy. Please follow written instructions given to you at your visit today.  °Please pick up your prep supplies at the pharmacy within the next 1-3 days. °If you use inhalers (even only as needed), please bring them with you on the day of your procedure. ° °Due to recent changes in healthcare laws, you may see the results of your imaging and laboratory studies on MyChart before your provider has had a chance to review them.  We understand that in some cases there may be results that are confusing or concerning to you. Not all laboratory results come back in the same time frame and the provider may be waiting for multiple results in order to interpret others.  Please give us 48 hours in order for your provider to thoroughly review all the results before contacting the office for clarification of your results.  ° °The [facility] providers would like to encourage you to use MYCHART to communicate with providers for non-urgent requests or questions.  Due to [facility] hold times on the telephone, sending your provider a message by MYCHART may be a faster and more efficient way to get a response.  Please allow 48 business hours for a response.  Please remember that this is for non-urgent requests.  ° °Thank you for choosing me and [facility]. ° °Malcolm T. Stark, Jr., MD., FACG ° °

## 2021-05-23 NOTE — Progress Notes (Signed)
History of Present Illness: This is a 66 year old male referred by Gaynelle Arabian, MD for the evaluation of blood in stool.  He relates intermittent problems with small amounts of bright red blood per rectum with bowel movements for several months.  He notes that larger quantities of almonds and spicy foods often exacerbate his bleeding.  He generally has 3-4 bowel movements per day and this bowel pattern has not changed in years.  No other gastrointestinal complaints.  Colonoscopy in Feb 2012 showed mild sigmoid diverticulosis and internal hemorrhoids.  Denies weight loss, abdominal pain, constipation, diarrhea, change in stool caliber, melena,  nausea, vomiting, dysphagia, reflux symptoms, chest pain.    No Known Allergies Outpatient Medications Prior to Visit  Medication Sig Dispense Refill   ibuprofen (ADVIL,MOTRIN) 200 MG tablet Take 200 mg by mouth every 6 (six) hours as needed.     Meclizine HCl 25 MG CHEW Chew 1 tablet (25 mg total) by mouth daily as needed. 30 tablet 3   No facility-administered medications prior to visit.   Past Medical History:  Diagnosis Date   Diverticulosis    Numbness    Vertigo    Past Surgical History:  Procedure Laterality Date   arm surgery     EYE SURGERY     Social History   Socioeconomic History   Marital status: Married    Spouse name: Not on file   Number of children: 2   Years of education: 16   Highest education level: Not on file  Occupational History   Not on file  Tobacco Use   Smoking status: Never   Smokeless tobacco: Never  Vaping Use   Vaping Use: Never used  Substance and Sexual Activity   Alcohol use: Yes   Drug use: No   Sexual activity: Not on file  Other Topics Concern   Not on file  Social History Narrative   Lives with wife in a 2 story home with basement.  Has 2 children.  Works as an Agricultural consultant.  Education: college.      Right handed   Social Determinants of Health   Financial Resource Strain:  Not on file  Food Insecurity: Not on file  Transportation Needs: Not on file  Physical Activity: Not on file  Stress: Not on file  Social Connections: Not on file   Family History  Problem Relation Age of Onset   Cataracts Maternal Grandfather    Strabismus Paternal Grandmother    Cataracts Paternal Grandfather    Amblyopia Paternal Grandfather    Blindness Paternal Grandfather    Diabetes Paternal Grandfather    Glaucoma Paternal Grandfather    Macular degeneration Paternal Grandfather    Retinal detachment Paternal Grandfather    Retinitis pigmentosa Paternal Grandfather       Review of Systems: Pertinent positive and negative review of systems were noted in the above HPI section. All other review of systems were otherwise negative.    Physical Exam: General: Well developed, well nourished, no acute distress Head: Normocephalic and atraumatic Eyes: Sclerae anicteric, EOMI Ears: Normal auditory acuity Mouth: Not examined, mask on during Covid-19 pandemic Neck: Supple, no masses or thyromegaly Lungs: Clear throughout to auscultation Heart: Regular rate and rhythm; no murmurs, rubs or bruits Abdomen: Soft, non tender and non distended. No masses, hepatosplenomegaly or hernias noted. Normal Bowel sounds Rectal: Deferred to colonoscopy Musculoskeletal: Symmetrical with no gross deformities  Skin: No lesions on visible extremities Pulses:  Normal pulses noted Extremities:  No clubbing, cyanosis, edema or deformities noted Neurological: Alert oriented x 4, grossly nonfocal Cervical Nodes:  No significant cervical adenopathy Inguinal Nodes: No significant inguinal adenopathy Psychological:  Alert and cooperative. Normal mood and affect   Assessment and Recommendations:  Intermittent painless hematochezia.  Suspected benign anorectal source such as hemorrhoids.  Rule out colorectal neoplasms, IBD.  Last colonoscopy in 2012.  Preparation H suppositories daily as needed.  Schedule colonoscopy. The risks (including bleeding, perforation, infection, missed lesions, medication reactions and possible hospitalization or surgery if complications occur), benefits, and alternatives to colonoscopy with possible biopsy and possible polypectomy were discussed with the patient and they consent to proceed.  If hemorrhoids are the etiology and symptoms persist consider hemorrhoidal banding.   cc: Gaynelle Arabian, MD [address] [place],  [place] [address]

## 2021-06-05 ENCOUNTER — Encounter: Payer: Self-pay | Admitting: Certified Registered Nurse Anesthetist

## 2021-06-09 ENCOUNTER — Telehealth: Payer: Self-pay | Admitting: Gastroenterology

## 2021-06-09 NOTE — Telephone Encounter (Signed)
Good Morning Dr. Fuller Plan,  ? ? ?Patient called to reschedule his colonoscopy with you on 3/20 due to his wife being in a car accident and patient having to take her to the Dr. The day of procedure. ? ? ?Patient was rescheduled for 4/20 at 4:00.  ?

## 2021-06-12 ENCOUNTER — Encounter: Payer: Medicare Other | Admitting: Gastroenterology

## 2021-07-13 ENCOUNTER — Ambulatory Visit (AMBULATORY_SURGERY_CENTER): Payer: Medicare Other | Admitting: Gastroenterology

## 2021-07-13 ENCOUNTER — Encounter: Payer: Self-pay | Admitting: Gastroenterology

## 2021-07-13 VITALS — BP 105/62 | HR 62 | Temp 97.3°F | Resp 12 | Ht 70.0 in | Wt 165.0 lb

## 2021-07-13 DIAGNOSIS — K921 Melena: Secondary | ICD-10-CM

## 2021-07-13 DIAGNOSIS — K635 Polyp of colon: Secondary | ICD-10-CM | POA: Diagnosis not present

## 2021-07-13 DIAGNOSIS — K5730 Diverticulosis of large intestine without perforation or abscess without bleeding: Secondary | ICD-10-CM

## 2021-07-13 DIAGNOSIS — K640 First degree hemorrhoids: Secondary | ICD-10-CM

## 2021-07-13 DIAGNOSIS — D120 Benign neoplasm of cecum: Secondary | ICD-10-CM

## 2021-07-13 MED ORDER — SODIUM CHLORIDE 0.9 % IV SOLN: 500.0000 mL | Freq: Once | INTRAVENOUS | Status: DC

## 2021-07-13 NOTE — Patient Instructions (Signed)
Read all of the handouts given to you by your recovery room nurse. ? ?YOU HAD AN ENDOSCOPIC PROCEDURE TODAY AT [facility]:   Refer to the procedure report that was given to you for any specific questions about what was found during the examination.  If the procedure report does not answer your questions, please call your gastroenterologist to clarify.  If you requested that your care partner not be given the details of your procedure findings, then the procedure report has been included in a sealed envelope for you to review at your convenience later. ? ?YOU SHOULD EXPECT: Some feelings of bloating in the abdomen. Passage of more gas than usual.  Walking can help get rid of the air that was put into your GI tract during the procedure and reduce the bloating. If you had a lower endoscopy (such as a colonoscopy or flexible sigmoidoscopy) you may notice spotting of blood in your stool or on the toilet paper. If you underwent a bowel prep for your procedure, you may not have a normal bowel movement for a few days. ? ?Please Note:  You might notice some irritation and congestion in your nose or some drainage.  This is from the oxygen used during your procedure.  There is no need for concern and it should clear up in a day or so. ? ?SYMPTOMS TO REPORT IMMEDIATELY: ? ?Following lower endoscopy (colonoscopy or flexible sigmoidoscopy): ? Excessive amounts of blood in the stool ? Significant tenderness or worsening of abdominal pains ? Swelling of the abdomen that is new, acute ? Fever of 100?F or higher ? ? ?For urgent or emergent issues, a gastroenterologist can be reached at any hour by calling [phone]. ?Do not use MyChart messaging for urgent concerns.  ? ? ?DIET:  We do recommend a small meal at first, but then you may proceed to your regular diet.  Drink plenty of fluids but you should avoid alcoholic beverages for 24 hours. ?Try to eat a high fiber diet, and drink plenty of water. ? ?ACTIVITY:   You should plan to take it easy for the rest of today and you should NOT DRIVE or use heavy machinery until tomorrow (because of the sedation medicines used during the test).   ? ?FOLLOW UP: ?Our staff will call the number listed on your records 48-72 hours following your procedure to check on you and address any questions or concerns that you may have regarding the information given to you following your procedure. If we do not reach you, we will leave a message.  We will attempt to reach you two times.  During this call, we will ask if you have developed any symptoms of COVID 19. If you develop any symptoms (ie: fever, flu-like symptoms, shortness of breath, cough etc.) before then, please call [phone].  If you test positive for Covid 19 in the 2 weeks post procedure, please call and report this information to [name].   ? ?If any biopsies were taken you will be contacted by phone or by letter within the next 1-3 weeks.  Please call us at [phone] if you have not heard about the biopsies in 3 weeks.  ? ? ?SIGNATURES/CONFIDENTIALITY: ?You and/or your care partner have signed paperwork which will be entered into your electronic medical record.  These signatures attest to the fact that that the information above on your After Visit Summary has been reviewed and is understood.  Full responsibility of the confidentiality of this discharge  information lies with you and/or your care-partner.  ?

## 2021-07-13 NOTE — Progress Notes (Signed)
Pt non-responsive, VVS, Report to RN  °

## 2021-07-13 NOTE — Progress Notes (Signed)
? ?History & Physical ? ?Primary Care Physician:  Gaynelle Arabian, MD ?Primary Gastroenterologist: Lucio Edward, MD ? ?CHIEF COMPLAINT:  Hematochezia  ? ?HPI: Robert Nelson is a 66 y.o. male with hematochezia for colonoscopy.   ? ? ?Past Medical History:  ?Diagnosis Date  ? Diverticulosis   ? Numbness   ? Vertigo   ? ? ?Past Surgical History:  ?Procedure Laterality Date  ? arm surgery    ? EYE SURGERY    ? ? ?Prior to Admission medications   ?Medication Sig Start Date End Date Taking? Authorizing Provider  ?ibuprofen (ADVIL,MOTRIN) 200 MG tablet Take 200 mg by mouth every 6 (six) hours as needed. ?Patient not taking: Reported on 07/13/2021    [provider]  ? ? ?Current Outpatient Medications  ?Medication Sig Dispense Refill  ? ibuprofen (ADVIL,MOTRIN) 200 MG tablet Take 200 mg by mouth every 6 (six) hours as needed. (Patient not taking: Reported on 07/13/2021)    ? ?Current Facility-Administered Medications  ?Medication Dose Route Frequency Provider Last Rate Last Admin  ? 0.9 %  sodium chloride infusion  500 mL Intravenous Once Ladene Artist, MD      ? ? ?Allergies as of 07/13/2021  ? (No Known Allergies)  ? ? ?Family History  ?Problem Relation Age of Onset  ? Colon polyps Paternal Uncle   ? Cataracts Maternal Grandfather   ? Strabismus Paternal Grandmother   ? Cataracts Paternal Grandfather   ? Amblyopia Paternal Grandfather   ? Blindness Paternal Grandfather   ? Diabetes Paternal Grandfather   ? Glaucoma Paternal Grandfather   ? Macular degeneration Paternal Grandfather   ? Retinal detachment Paternal Grandfather   ? Retinitis pigmentosa Paternal Grandfather   ? Rectal cancer Neg Hx   ? Stomach cancer Neg Hx   ? Esophageal cancer Neg Hx   ? ? ?Social History  ? ?Socioeconomic History  ? Marital status: Married  ?  Spouse name: Not on file  ? Number of children: 2  ? Years of education: 28  ? Highest education level: Not on file  ?Occupational History  ? Not on file  ?Tobacco Use  ? Smoking  status: Never  ? Smokeless tobacco: Never  ?Vaping Use  ? Vaping Use: Never used  ?Substance and Sexual Activity  ? Alcohol use: Yes  ? Drug use: No  ? Sexual activity: Not on file  ?Other Topics Concern  ? Not on file  ?Social History Narrative  ? Lives with wife in a 2 story home with basement.  Has 2 children.  Works as an Agricultural consultant.  Education: college.     ? Right handed  ? ?Social Determinants of Health  ? ?Financial Resource Strain: Not on file  ?Food Insecurity: Not on file  ?Transportation Needs: Not on file  ?Physical Activity: Not on file  ?Stress: Not on file  ?Social Connections: Not on file  ?Intimate Partner Violence: Not on file  ? ? ?Review of Systems: ? ?All systems reviewed an negative except where noted in HPI. ? ?Gen: Denies any fever, chills, sweats, anorexia, fatigue, weakness, malaise, weight loss, and sleep disorder ?CV: Denies chest pain, angina, palpitations, syncope, orthopnea, PND, peripheral edema, and claudication. ?Resp: Denies dyspnea at rest, dyspnea with exercise, cough, sputum, wheezing, coughing up blood, and pleurisy. ?GI: Denies vomiting blood, jaundice, and fecal incontinence.   Denies dysphagia or odynophagia. ?GU : Denies urinary burning, blood in urine, urinary frequency, urinary hesitancy, nocturnal urination, and urinary incontinence. ?MS: Denies  joint pain, limitation of movement, and swelling, stiffness, low back pain, extremity pain. Denies muscle weakness, cramps, atrophy.  ?Derm: Denies rash, itching, dry skin, hives, moles, warts, or unhealing ulcers.  ?Psych: Denies depression, anxiety, memory loss, suicidal ideation, hallucinations, paranoia, and confusion. ?Heme: Denies bruising, bleeding, and enlarged lymph nodes. ?Neuro:  Denies any headaches, dizziness, paresthesias. ?Endo:  Denies any problems with DM, thyroid, adrenal function. ? ? ?Physical Exam: ?General:  Alert, well-developed, in NAD ?Head:  Normocephalic and atraumatic. ?Eyes:  Sclera clear, no  icterus.   Conjunctiva pink. ?Ears:  Normal auditory acuity. ?Mouth:  No deformity or lesions.  ?Neck:  Supple; no masses . ?Lungs:  Clear throughout to auscultation.   No wheezes, crackles, or rhonchi. No acute distress. ?Heart:  Regular rate and rhythm; no murmurs. ?Abdomen:  Soft, nondistended, nontender. No masses, hepatomegaly. No obvious masses.  Normal bowel .    ?Rectal:  Deferred   ?Msk:  Symmetrical without gross deformities.Marland Kitchen ?Pulses:  Normal pulses noted. ?Extremities:  Without edema. ?Neurologic:  Alert and  oriented x4;  grossly normal neurologically. ?Skin:  Intact without significant lesions or rashes. ?Cervical Nodes:  No significant cervical adenopathy. ?Psych:  Alert and cooperative. Normal mood and affect. ? ? ?Impression / Plan:  ? ?Hematochezia for colonoscopy. ? ?[name] T. Fuller Plan  07/13/2021, 2:03 PM ?See Shea Evans, [facility], to contact our on call provider ? ? ?  ?

## 2021-07-13 NOTE — Progress Notes (Signed)
Called to room to assist during endoscopic procedure.  Patient ID and intended procedure confirmed with present staff. Received instructions for my participation in the procedure from the performing physician.  

## 2021-07-13 NOTE — Progress Notes (Signed)
Pt's states no medical or surgical changes since previsit or office visit. VS assessed by [facility] ?

## 2021-07-13 NOTE — Op Note (Signed)
[place] ?Patient Name: Robert Nelson ?Procedure Date: 07/13/2021 2:01 PM ?MRN: [id number] ?Endoscopist: Ladene Artist , MD ?Age: 66 ?Referring MD:  ?Date of Birth: 1956/03/26 ?Gender: Male ?Account #: [id number] ?Procedure:                Colonoscopy ?Indications:              Hematochezia ?Medicines:                Monitored Anesthesia Care ?Procedure:                Pre-Anesthesia Assessment: ?                          - Prior to the procedure, a History and Physical  ?                          was performed, and patient medications and  ?                          allergies were reviewed. The patient's tolerance of  ?                          previous anesthesia was also reviewed. The risks  ?                          and benefits of the procedure and the sedation  ?                          options and risks were discussed with the patient.  ?                          All questions were answered, and informed consent  ?                          was obtained. Prior Anticoagulants: The patient has  ?                          taken no previous anticoagulant or antiplatelet  ?                          agents. ASA Grade Assessment: II - A patient with  ?                          mild systemic disease. After reviewing the risks  ?                          and benefits, the patient was deemed in  ?                          satisfactory condition to undergo the procedure. ?                          After obtaining informed consent, the colonoscope  ?  was passed under direct vision. Throughout the  ?                          procedure, the patient's blood pressure, pulse, and  ?                          oxygen saturations were monitored continuously. The  ?                          CF [id number] #[id number] was introduced through the anus  ?                          and advanced to the the cecum, identified by  ?                          appendiceal orifice and ileocecal valve. The  ?                           ileocecal valve, appendiceal orifice, and rectum  ?                          were photographed. The quality of the bowel  ?                          preparation was good. The patient tolerated the  ?                          procedure well. The colonoscopy was somewhat  ?                          difficult due to a redundant colon, significant  ?                          looping and a tortuous colon. ?Scope In: 2:08:28 PM ?Scope Out: 2:31:39 PM ?Scope Withdrawal Time: 0 hours 11 minutes 30 seconds  ?Total Procedure Duration: 0 hours 23 minutes 11 seconds  ?Findings:                 The perianal and digital rectal examinations were  ?                          normal. ?                          A 10 mm polyp was found in the cecum. The polyp was  ?                          sessile. The polyp was removed with a cold snare.  ?                          Resection and retrieval were complete. ?                          A few small-mouthed diverticula were found in the  ?  left colon. There was no evidence of diverticular  ?                          bleeding. ?                          Internal hemorrhoids were found during  ?                          retroflexion. The hemorrhoids were moderate and  ?                          Grade I (internal hemorrhoids that do not prolapse). ?                          The exam was otherwise without abnormality on  ?                          direct and retroflexion views. ?Complications:            No immediate complications. Estimated blood loss:  ?                          None. ?Estimated Blood Loss:     Estimated blood loss: none. ?Impression:               - One 10 mm polyp in the cecum, removed with a cold  ?                          snare. Resected and retrieved. ?                          - Mild diverticulosis in the left colon. ?                          - Internal hemorrhoids. ?                          - The examination was otherwise  normal on direct  ?                          and retroflexion views. ?Recommendation:           - Repeat colonoscopy after studies are complete for  ?                          surveillance based on pathology results. ?                          - Patient has a contact number available for  ?                          emergencies. The signs and symptoms of potential  ?                          delayed complications were discussed with the  ?  patient. Return to normal activities tomorrow.  ?                          Written discharge instructions were provided to the  ?                          patient. ?                          - High fiber diet. ?                          - Continue present medications. ?                          - Await pathology results. ?                          - Consider hemorrhoid banding for recurrent  ?                          bleeding. ?Ladene Artist, MD ?07/13/2021 2:41:51 PM ?This report has been signed electronically. ?

## 2021-07-17 ENCOUNTER — Telehealth: Payer: Self-pay

## 2021-07-17 NOTE — Telephone Encounter (Signed)
?  Follow up Call- ? ? ?  07/13/2021  ?  1:11 PM  ?Call back number  ?Post procedure Call Back phone  # [phone]  ?Permission to leave phone message Yes  ?  ? ?Patient questions: ? ?Do you have a fever, pain , or abdominal swelling? No. ?Pain Score  0 * ? ?Have you tolerated food without any problems? Yes.   ? ?Have you been able to return to your normal activities? Yes.   ? ?Do you have any questions about your discharge instructions: ?Diet   No. ?Medications  No. ?Follow up visit  No. ? ?Do you have questions or concerns about your Care? No. ? ?Actions: ?* If pain score is 4 or above: ?No action needed, pain <4. ? ? ?

## 2021-07-31 ENCOUNTER — Encounter: Payer: Self-pay | Admitting: Gastroenterology
